# Patient Record
Sex: Female | Born: 1955 | Race: White | Hispanic: No | Smoking: Never smoker
Health system: Southern US, Community
[De-identification: ages and names within clinical notes are randomized; demographics above are authoritative.]

## PROBLEM LIST (undated history)

## (undated) DIAGNOSIS — G629 Polyneuropathy, unspecified: Secondary | ICD-10-CM

## (undated) DIAGNOSIS — H269 Unspecified cataract: Secondary | ICD-10-CM

## (undated) DIAGNOSIS — E119 Type 2 diabetes mellitus without complications: Secondary | ICD-10-CM

## (undated) DIAGNOSIS — F32A Depression, unspecified: Secondary | ICD-10-CM

## (undated) DIAGNOSIS — F329 Major depressive disorder, single episode, unspecified: Secondary | ICD-10-CM

## (undated) DIAGNOSIS — J40 Bronchitis, not specified as acute or chronic: Secondary | ICD-10-CM

## (undated) HISTORY — DX: Unspecified cataract: H26.9

## (undated) HISTORY — PX: ABDOMINAL HYSTERECTOMY: SHX81

## (undated) HISTORY — PX: CARPAL TUNNEL RELEASE: SHX101

## (undated) HISTORY — DX: Polyneuropathy, unspecified: G62.9

## (undated) HISTORY — PX: KNEE SURGERY: SHX244

---

## 2007-06-20 ENCOUNTER — Emergency Department (HOSPITAL_COMMUNITY): Admission: EM | Admit: 2007-06-20 | Discharge: 2007-06-21 | Payer: Self-pay | Admitting: Emergency Medicine

## 2010-12-17 LAB — COMPREHENSIVE METABOLIC PANEL
ALT: 26
Albumin: 3.9
Alkaline Phosphatase: 57
BUN: 21
Chloride: 105
Glucose, Bld: 144 — ABNORMAL HIGH
Potassium: 3.6
Sodium: 140
Total Bilirubin: 0.4
Total Protein: 7

## 2010-12-17 LAB — DIFFERENTIAL
Basophils Absolute: 0.2 — ABNORMAL HIGH
Basophils Relative: 2 — ABNORMAL HIGH
Eosinophils Relative: 2
Monocytes Absolute: 0.4
Monocytes Relative: 5

## 2010-12-17 LAB — POCT CARDIAC MARKERS: CKMB, poc: <1 — ABNORMAL LOW

## 2010-12-17 LAB — POCT I-STAT, CHEM 8
BUN: 26 — ABNORMAL HIGH
Calcium, Ion: 1.26
Chloride: 106
Glucose, Bld: 137 — ABNORMAL HIGH
HCT: 36
Potassium: 3.9

## 2010-12-17 LAB — D-DIMER, QUANTITATIVE: D-Dimer, Quant: 1.07 — ABNORMAL HIGH

## 2010-12-17 LAB — CBC
HCT: 34.8 — ABNORMAL LOW
Hemoglobin: 11.8 — ABNORMAL LOW
MCHC: 33.8
MCV: 87.9
RBC: 3.96
RDW: 14.4

## 2013-04-07 ENCOUNTER — Telehealth: Payer: Self-pay | Admitting: Cardiology

## 2013-04-07 NOTE — Telephone Encounter (Signed)
New message     Dr Jens Somrenshaw completed a FMLA form but there is one piece of missing information

## 2013-04-07 NOTE — Telephone Encounter (Signed)
Left message for Ann Pace, gave him the number to [employer] if he is missing paperwork. Left our contact number if he needs to talk with me.

## 2013-04-12 NOTE — Telephone Encounter (Signed)
Spoke with Ann Pace, questions regarding FMLA paperwork answered.

## 2013-12-25 ENCOUNTER — Encounter (HOSPITAL_BASED_OUTPATIENT_CLINIC_OR_DEPARTMENT_OTHER): Payer: Self-pay | Admitting: Emergency Medicine

## 2013-12-25 ENCOUNTER — Emergency Department (HOSPITAL_BASED_OUTPATIENT_CLINIC_OR_DEPARTMENT_OTHER)
Admission: EM | Admit: 2013-12-25 | Discharge: 2013-12-26 | Disposition: A | Discharge status: Home / Self Care | Payer: BC Managed Care – PPO | Attending: Emergency Medicine | Admitting: Emergency Medicine

## 2013-12-25 ENCOUNTER — Emergency Department (HOSPITAL_BASED_OUTPATIENT_CLINIC_OR_DEPARTMENT_OTHER): Payer: BC Managed Care – PPO

## 2013-12-25 DIAGNOSIS — Z794 Long term (current) use of insulin: Secondary | ICD-10-CM | POA: Diagnosis not present

## 2013-12-25 DIAGNOSIS — Z880 Allergy status to penicillin: Secondary | ICD-10-CM | POA: Diagnosis not present

## 2013-12-25 DIAGNOSIS — E119 Type 2 diabetes mellitus without complications: Secondary | ICD-10-CM | POA: Insufficient documentation

## 2013-12-25 DIAGNOSIS — R1033 Periumbilical pain: Secondary | ICD-10-CM | POA: Diagnosis not present

## 2013-12-25 DIAGNOSIS — R197 Diarrhea, unspecified: Secondary | ICD-10-CM | POA: Insufficient documentation

## 2013-12-25 DIAGNOSIS — R000 Tachycardia, unspecified: Secondary | ICD-10-CM | POA: Diagnosis not present

## 2013-12-25 DIAGNOSIS — Z79899 Other long term (current) drug therapy: Secondary | ICD-10-CM | POA: Insufficient documentation

## 2013-12-25 DIAGNOSIS — Z90710 Acquired absence of both cervix and uterus: Secondary | ICD-10-CM | POA: Diagnosis not present

## 2013-12-25 DIAGNOSIS — R112 Nausea with vomiting, unspecified: Secondary | ICD-10-CM | POA: Diagnosis present

## 2013-12-25 HISTORY — DX: Type 2 diabetes mellitus without complications: E11.9

## 2013-12-25 LAB — COMPREHENSIVE METABOLIC PANEL
ALK PHOS: 98 U/L (ref 39–117)
ALT: 28 U/L (ref 0–35)
AST: 14 U/L (ref 0–37)
Albumin: 4.5 g/dL (ref 3.5–5.2)
Anion gap: 23 — ABNORMAL HIGH (ref 5–15)
BILIRUBIN TOTAL: 0.3 mg/dL (ref 0.3–1.2)
BUN: 37 mg/dL — ABNORMAL HIGH (ref 6–23)
CHLORIDE: 96 meq/L (ref 96–112)
CO2: 20 meq/L (ref 19–32)
Calcium: 11.6 mg/dL — ABNORMAL HIGH (ref 8.4–10.5)
Creatinine, Ser: 1.1 mg/dL (ref 0.50–1.10)
GFR calc Af Amer: 64 mL/min — ABNORMAL LOW (ref >90)
GFR, EST NON AFRICAN AMERICAN: 55 mL/min — AB (ref >90)
Glucose, Bld: 398 mg/dL — ABNORMAL HIGH (ref 70–99)
POTASSIUM: 5 meq/L (ref 3.7–5.3)
SODIUM: 139 meq/L (ref 137–147)
Total Protein: 8.7 g/dL — ABNORMAL HIGH (ref 6.0–8.3)

## 2013-12-25 LAB — CBC
HCT: 47.2 % — ABNORMAL HIGH (ref 36.0–46.0)
Hemoglobin: 15.8 g/dL — ABNORMAL HIGH (ref 12.0–15.0)
MCH: 28.5 pg (ref 26.0–34.0)
MCHC: 33.5 g/dL (ref 30.0–36.0)
MCV: 85.2 fL (ref 78.0–100.0)
PLATELETS: 488 10*3/uL — AB (ref 150–400)
RBC: 5.54 MIL/uL — AB (ref 3.87–5.11)
RDW: 14.2 % (ref 11.5–15.5)
WBC: 15.5 10*3/uL — AB (ref 4.0–10.5)

## 2013-12-25 LAB — I-STAT VENOUS BLOOD GAS, ED
ACID-BASE DEFICIT: 4 mmol/L — AB (ref 0.0–2.0)
BICARBONATE: 21 meq/L (ref 20.0–24.0)
O2 Saturation: 55 %
TCO2: 22 mmol/L (ref 0–100)
pCO2, Ven: 37.9 mmHg — ABNORMAL LOW (ref 45.0–50.0)
pH, Ven: 7.352 — ABNORMAL HIGH (ref 7.250–7.300)
pO2, Ven: 30 mmHg (ref 30.0–45.0)

## 2013-12-25 LAB — CBG MONITORING, ED: GLUCOSE-CAPILLARY: 382 mg/dL — AB (ref 70–99)

## 2013-12-25 LAB — I-STAT CG4 LACTIC ACID, ED: Lactic Acid, Venous: 1.49 mmol/L (ref 0.5–2.2)

## 2013-12-25 MED ORDER — SODIUM CHLORIDE 0.9 % IV BOLUS (SEPSIS)
1000.0000 mL | Freq: Once | INTRAVENOUS | Status: AC
  Administered 2013-12-25: 1000 mL via INTRAVENOUS

## 2013-12-25 MED ORDER — IOHEXOL 300 MG/ML  SOLN: 100.0000 mL | Freq: Once | INTRAMUSCULAR | Status: AC | PRN

## 2013-12-25 MED ORDER — IOHEXOL 300 MG/ML  SOLN
50.0000 mL | Freq: Once | INTRAMUSCULAR | Status: AC | PRN
  Administered 2013-12-25: 50 mL via ORAL

## 2013-12-25 MED ORDER — ONDANSETRON HCL 4 MG/2ML IJ SOLN
4.0000 mg | Freq: Once | INTRAMUSCULAR | Status: AC
  Administered 2013-12-25: 4 mg via INTRAVENOUS
  Filled 2013-12-25: qty 2

## 2013-12-25 MED ORDER — MORPHINE SULFATE 4 MG/ML IJ SOLN
4.0000 mg | Freq: Once | INTRAMUSCULAR | Status: AC
  Administered 2013-12-25: 4 mg via INTRAVENOUS
  Filled 2013-12-25: qty 1

## 2013-12-25 MED ORDER — ONDANSETRON 4 MG PO TBDP
4.0000 mg | ORAL_TABLET | Freq: Once | ORAL | Status: AC
  Administered 2013-12-25: 4 mg via ORAL
  Filled 2013-12-25: qty 1

## 2013-12-25 NOTE — ED Notes (Addendum)
Pt reports N/V/D since yesterday with chills, sweats. CBG 382 in triage.

## 2013-12-25 NOTE — ED Provider Notes (Signed)
CSN: [id number]     Arrival date & time 12/25/13  1940 History   Chief Complaint  Patient presents with  . Emesis   Patient is a 58 y.o. female presenting with vomiting. The history is provided by the patient. No language interpreter was used.  Emesis Severity:  Moderate Duration:  1 day Timing:  Constant Quality:  Stomach contents Progression:  Worsening Chronicity:  New Relieved by:  Nothing Worsened by:  Nothing tried Ineffective treatments:  None tried Associated symptoms: abdominal pain and diarrhea   Associated symptoms: no chills, no cough, no fever, no sore throat and no URI   Risk factors: diabetes    HPI Comments: Ann Pace is a 58 y.o. female who presents to the Emergency Department complaining of N/V/D since last night  Past Medical History  Diagnosis Date  . Diabetes mellitus without complication    Past Surgical History  Procedure Laterality Date  . Abdominal hysterectomy     History reviewed. No pertinent family history. History  Substance Use Topics  . Smoking status: Never Smoker   . Smokeless tobacco: Not on file  . Alcohol Use: No   OB History   Grav Para Term Preterm Abortions TAB SAB Ect Mult Living                 Review of Systems  Constitutional: Negative for chills.  HENT: Negative for sore throat.   Respiratory: Negative for cough and shortness of breath.   Gastrointestinal: Positive for vomiting, abdominal pain and diarrhea.  All other systems reviewed and are negative.   Allergies  Penicillins  Home Medications   Prior to Admission medications   Medication Sig Start Date End Date Taking? Authorizing Provider  DULoxetine (CYMBALTA) 60 MG capsule Take 60 mg by mouth daily.   Yes Historical Provider, MD  insulin glargine (LANTUS) 100 UNIT/ML injection Inject 37 Units into the skin 2 (two) times daily.   Yes Historical Provider, MD  insulin lispro (HUMALOG) 100 UNIT/ML injection Inject 16 Units into the skin 3 (three) times daily  with meals.   Yes Historical Provider, MD  lisinopril (PRINIVIL,ZESTRIL) 10 MG tablet Take 10 mg by mouth daily.   Yes Historical Provider, MD  METFORMIN HCL PO Take by mouth 2 (two) times daily.   Yes Historical Provider, MD  Pioglitazone HCl (ACTOS PO) Take by mouth 2 (two) times daily.   Yes Historical Provider, MD   BP 135/99  Pulse 130  Temp(Src) 98 F (36.7 C) (Oral)  Resp 21  Ht 5\' 4"  (1.626 m)  Wt 230 lb (104.327 kg)  BMI 39.46 kg/m2  SpO2 95% Physical Exam  Nursing note and vitals reviewed. Constitutional: She is oriented to person, place, and time. She appears well-developed and well-nourished. No distress.  HENT:  Head: Normocephalic and atraumatic.  Mouth/Throat: Oropharynx is clear and moist.  Eyes: EOM are normal. Pupils are equal, round, and reactive to light.  Neck: Normal range of motion. Neck supple.  Cardiovascular: Regular rhythm.  Tachycardia present.  Exam reveals no friction rub.   No murmur heard. Pulmonary/Chest: Effort normal and breath sounds normal. No respiratory distress. She has no wheezes. She has no rales.  Abdominal: Soft. She exhibits no distension. There is tenderness in the periumbilical area. There is no rebound.  Musculoskeletal: Normal range of motion. She exhibits no edema.  Neurological: She is alert and oriented to person, place, and time.  Skin: No rash noted. She is not diaphoretic.    ED Course  Procedures (including critical care time) DIAGNOSTIC STUDIES: Oxygen Saturation is 95% on RA, normal by my interpretation.    COORDINATION OF CARE: 8:54 PM- Pt advised of plan for treatment and pt agrees.  Labs Review Labs Reviewed  CBG MONITORING, ED - Abnormal; Notable for the following:    Glucose-Capillary 382 (*)    All other components within normal limits  CBC  COMPREHENSIVE METABOLIC PANEL  URINALYSIS, ROUTINE W REFLEX MICROSCOPIC  BLOOD GAS, VENOUS  I-STAT CG4 LACTIC ACID, ED    Imaging Review Ct Abdomen Pelvis W  Contrast  12/25/2013   CLINICAL DATA:  Nausea, vomiting, and diarrhea since yesterday. Chills and sweats.  EXAM: CT ABDOMEN AND PELVIS WITH CONTRAST  TECHNIQUE: Multidetector CT imaging of the abdomen and pelvis was performed using the standard protocol following bolus administration of intravenous contrast.  CONTRAST:  50mL OMNIPAQUE IOHEXOL 300 MG/ML  SOLN  COMPARISON:  None.  FINDINGS: Linear opacities in the lung bases likely representing atelectasis.  Mild diffuse fatty infiltration of the liver. The gallbladder, pancreas, spleen, adrenal glands, abdominal aorta, inferior vena cava, and retroperitoneal lymph nodes are unremarkable. Small accessory spleen. Focal areas of scarring in both kidneys, greater on the left. Small cyst on the right kidney. No hydronephrosis or solid renal mass identified. Stomach, small bowel, and colon are not abnormally distended. No free air or free fluid in the abdomen. Abdominal wall musculature appears intact.  Pelvis: Bladder wall is not thickened. Uterus appears to be surgically absent. No pelvic mass or lymphadenopathy. Appendix is not identified. No inflammatory changes in the pelvis. No free or loculated pelvic fluid collections. No destructive bone lesions.  IMPRESSION: Diffuse fatty infiltration of the liver. Probable atelectasis in the lung bases. No acute process demonstrated otherwise in the abdomen or pelvis.   Electronically Signed   By: Burman NievesWilliam  Stevens M.D.   On: 12/25/2013 23:51     EKG Interpretation None      MDM   Final diagnoses:  Nausea vomiting and diarrhea    58 year old female with history of diabetes presents with nausea, vomiting, diarrhea. Began last night. No fevers, no dysuria. Patient has periumbilical pain on exam. No guarding or rebound. Missed her insulin this morning. Here she is hyperglycemic in the 380s. We'll check labs, give fluids. We'll plan for CT of her abdomen. CT negative. Labs ok. HR improving, feeling better and resting  comfortably on re-exam. Tolerating PO. Stable for discharge. I have reviewed all labs and imaging and considered them in my medical decision making.   Elwin MochaBlair Sueko Dimichele, MD 12/26/13 [phone]

## 2013-12-26 LAB — CBG MONITORING, ED: Glucose-Capillary: 284 mg/dL — ABNORMAL HIGH (ref 70–99)

## 2013-12-26 MED ORDER — ONDANSETRON 4 MG PO TBDP: ORAL_TABLET | ORAL | Status: DC

## 2013-12-26 NOTE — Discharge Instructions (Signed)

## 2013-12-26 NOTE — ED Notes (Signed)
I took CBG and got result of 284 mg. / dcltr.

## 2015-12-12 DIAGNOSIS — E785 Hyperlipidemia, unspecified: Secondary | ICD-10-CM

## 2015-12-12 DIAGNOSIS — I10 Essential (primary) hypertension: Secondary | ICD-10-CM | POA: Insufficient documentation

## 2015-12-12 DIAGNOSIS — E1169 Type 2 diabetes mellitus with other specified complication: Secondary | ICD-10-CM | POA: Insufficient documentation

## 2017-04-26 ENCOUNTER — Emergency Department (HOSPITAL_COMMUNITY): Payer: Self-pay

## 2017-04-26 ENCOUNTER — Emergency Department (HOSPITAL_COMMUNITY)
Admission: EM | Admit: 2017-04-26 | Discharge: 2017-04-26 | Disposition: A | Discharge status: Home / Self Care | Payer: Self-pay | Attending: Emergency Medicine | Admitting: Emergency Medicine

## 2017-04-26 ENCOUNTER — Encounter (HOSPITAL_COMMUNITY): Payer: Self-pay | Admitting: *Deleted

## 2017-04-26 DIAGNOSIS — E119 Type 2 diabetes mellitus without complications: Secondary | ICD-10-CM | POA: Insufficient documentation

## 2017-04-26 DIAGNOSIS — M25512 Pain in left shoulder: Secondary | ICD-10-CM | POA: Insufficient documentation

## 2017-04-26 DIAGNOSIS — Z794 Long term (current) use of insulin: Secondary | ICD-10-CM | POA: Insufficient documentation

## 2017-04-26 DIAGNOSIS — Z79899 Other long term (current) drug therapy: Secondary | ICD-10-CM | POA: Insufficient documentation

## 2017-04-26 MED ORDER — ACETAMINOPHEN 500 MG PO TABS
1000.0000 mg | ORAL_TABLET | Freq: Once | ORAL | Status: AC
  Administered 2017-04-26: 1000 mg via ORAL
  Filled 2017-04-26: qty 2

## 2017-04-26 NOTE — ED Provider Notes (Signed)
[facility] EMERGENCY DEPARTMENT Provider Note   CSN: [id number] Arrival date & time: 04/26/17  1752     History   Chief Complaint Chief Complaint  Patient presents with  . Shoulder Pain    HPI Ann Pace is a 62 y.o. female with history of diabetes and peripheral neuropathy presents today with chief complaint acute onset, progressively worsening left shoulder pain for 3 days.  She states that pain is constant, aching and occasionally sharp.  Pain radiates down her entire upper extremity and she will also have circumferential numbness intermittently.  She denies weakness, fevers, chills, neck pain, chest pain, or shortness of breath.  No prior history of DVT or PE.  She has tried Neurontin which she takes for her neuropathy in her lower extremities without significant relief of her symptoms and she has also applied a cream to her shoulder without significant relief.  Pain worsens with range of motion and palpation.  She notes that she has been under a lot of stress.  Recently her husband passed away and she also lost her job and today she had to take her father to the hospital for evaluation as well.  She states that she thinks her heart rate has been fast as a result.  The history is provided by the patient.    Past Medical History:  Diagnosis Date  . Diabetes mellitus without complication (HCC)     There are no active problems to display for this patient.   Past Surgical History:  Procedure Laterality Date  . ABDOMINAL HYSTERECTOMY      OB History    No data available       Home Medications    Prior to Admission medications   Medication Sig Start Date End Date Taking? Authorizing Provider  DULoxetine (CYMBALTA) 60 MG capsule Take 60 mg by mouth daily.    [provider]  insulin glargine (LANTUS) 100 UNIT/ML injection Inject 37 Units into the skin 2 (two) times daily.    [provider]  insulin lispro (HUMALOG) 100 UNIT/ML  injection Inject 16 Units into the skin 3 (three) times daily with meals.    [provider]  lisinopril (PRINIVIL,ZESTRIL) 10 MG tablet Take 10 mg by mouth daily.    [provider]  METFORMIN HCL PO Take by mouth 2 (two) times daily.    [provider]  ondansetron (ZOFRAN ODT) 4 MG disintegrating tablet 4mg  ODT q4 hours prn nausea/vomit 12/26/13   Elwin Mocha, MD  Pioglitazone HCl (ACTOS PO) Take by mouth 2 (two) times daily.    [provider]    Family History No family history on file.  Social History Social History   Tobacco Use  . Smoking status: Never Smoker  . Smokeless tobacco: Never Used  Substance Use Topics  . Alcohol use: No  . Drug use: No     Allergies   Penicillins   Review of Systems Review of Systems  Constitutional: Negative for chills and fever.  Respiratory: Negative for cough and shortness of breath.   Cardiovascular: Negative for chest pain.  Gastrointestinal: Negative for abdominal pain, nausea and vomiting.  Musculoskeletal: Positive for arthralgias (Left shoulder). Negative for back pain and neck pain.  Neurological: Positive for numbness. Negative for syncope and weakness.     Physical Exam Updated Vital Signs BP (!) 147/77 (BP Location: Right Arm)   Pulse 99   Temp 98.6 F (37 C) (Oral)   Resp 15   Ht 5'  4.5" (1.638 m)   Wt 77.1 kg (170 lb)   SpO2 93%   BMI 28.73 kg/m    Physical Exam  Constitutional: She is oriented to person, place, and time. She appears well-developed and well-nourished. No distress.  HENT:  Head: Normocephalic and atraumatic.  Eyes: Conjunctivae are normal. Right eye exhibits no discharge. Left eye exhibits no discharge.  Neck: Normal range of motion. Neck supple. No JVD present. No tracheal deviation present.  No midline spine TTP, no paraspinal muscle tenderness, no deformity, crepitus, or step-off noted   Cardiovascular: Normal rate, regular rhythm and intact distal  pulses.  2+ DP/PT pulses bilaterally  Pulmonary/Chest: Effort normal and breath sounds normal. No stridor. No respiratory distress. She has no wheezes. She has no rales. She exhibits no tenderness.  Abdominal: She exhibits no distension.  Musculoskeletal: She exhibits tenderness. She exhibits no edema.  Decreased passive range of motion with abduction, abduction, and upward range of motion of the left shoulder secondary to pain.  Normal passive range of motion with no restriction.  She has point tenderness overlying the left acromioclavicular joint with no erythema, swelling, deformity, or crepitus.  5/5 strength of BUE major muscle groups.  Positive empty can sign and positive Hawkins and Neer's impingement test on the left.  Positive arc of pain.  Neurological: She is alert and oriented to person, place, and time. No cranial nerve deficit or sensory deficit. She exhibits normal muscle tone.  Fluent speech, no facial droop, sensation intact to soft touch of bilateral upper extremities good grip strength bilaterally  Skin: Skin is warm and dry. No erythema.  Psychiatric: She has a normal mood and affect. Her behavior is normal.  Nursing note and vitals reviewed.    ED Treatments / Results  Labs (all labs ordered are listed, but only abnormal results are displayed) Labs Reviewed - No data to display  EKG  EKG Interpretation  Date/Time:  Saturday April 26 2017 18:16:10 EST Ventricular Rate:  100 PR Interval:  138 QRS Duration: 90 QT Interval:  350 QTC Calculation: 451 R Axis:   -60 Text Interpretation:  Normal sinus rhythm Left axis deviation Cannot rule out Anterior infarct , age undetermined Abnormal ECG Confirmed by Kristine RoyalMessick, Peter [phone]([id number]) on 04/26/2017 7:41:30 PM       Radiology Dg Shoulder Left  Result Date: 04/26/2017 CLINICAL DATA:  Acute left shoulder pain without known injury. EXAM: LEFT SHOULDER - 2+ VIEW COMPARISON:  None. FINDINGS: There is no evidence of fracture or  dislocation. Mild degenerative changes seen involving the left acromioclavicular joint. Soft tissues are unremarkable. 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 No acute abnormality seen in the left shoulder. Electronically Signed   By: Lupita RaiderJames  Green Jr, M.D.   On: 04/26/2017 19:15    Procedures Procedures (including critical care time)  Medications Ordered in ED Medications  acetaminophen (TYLENOL) tablet 1,000 mg (not administered)     Initial Impression / Assessment and Plan / ED Course  I have reviewed the triage vital signs and the nursing notes.  Pertinent labs & imaging results that were available during my care of the patient were reviewed by me and considered in my medical decision making (see chart for details).     Patient with left shoulder pain reproducible on palpation and with range of motion.  Afebrile, initially tachycardic with improvement while in the ED.  She states that she is under a lot of stress and thinks that her tachycardia is secondary  to this.  EKG shows no ST segment abnormality or arrhythmia.  I doubt ACS or MI in the absence of chest pain.  I doubt PE in the absence of shortness of breath or hypoxia.  Pain appears to be entirely musculoskeletal in nature she is focally tender overlying an area of arthritis seen on her radiographs.  Doubt septic joint or gout. RICE therapy indicated and discussed.  She will follow-up with [facility] for reevaluation of her symptoms and further recommendations.  Discussed indications for return to the ED. Pt verbalized understanding of and agreement with plan and is safe for discharge home at this time. No complaints prior to discharge.  Final Clinical Impressions(s) / ED Diagnoses   Final diagnoses:  Acute pain of left shoulder    ED Discharge Orders    None      Bennye Alm 04/26/17 1953  Wynetta Fines, MD 04/27/17 [phone]

## 2017-04-26 NOTE — ED Triage Notes (Signed)
Pt c/o L shoulder pain onset x 3 days, denies injury, pt reports numbness running down her arm, pt denies SOB, pt denies  N/v/d, A&O x4

## 2017-04-26 NOTE — Discharge Instructions (Signed)
1. Medications: Alternate 600 mg of ibuprofen and [phone] mg of Tylenol every 3 hours as needed for pain. Do not exceed 4000 mg of Tylenol daily, usual home medications 2. Treatment: rest, ice, elevate, drink plenty of fluids, gentle stretching 3. Follow Up: Please followup with orthopedics as directed or your PCP in 1 week if no improvement for discussion of your diagnoses and further evaluation after today's visit; if you do not have a primary care doctor use the resource guide provided to find one; Please return to the ER for worsening symptoms or other concerns such as fevers, weakness, chest pain, or shortness of breath

## 2017-05-15 ENCOUNTER — Encounter (INDEPENDENT_AMBULATORY_CARE_PROVIDER_SITE_OTHER): Payer: Self-pay | Admitting: Physician Assistant

## 2017-05-15 ENCOUNTER — Ambulatory Visit (INDEPENDENT_AMBULATORY_CARE_PROVIDER_SITE_OTHER): Payer: Self-pay | Admitting: Physician Assistant

## 2017-05-15 VITALS — BP 148/79 | HR 91 | Temp 98.1°F | Resp 18 | Ht 64.5 in | Wt 187.0 lb

## 2017-05-15 DIAGNOSIS — F418 Other specified anxiety disorders: Secondary | ICD-10-CM

## 2017-05-15 DIAGNOSIS — H269 Unspecified cataract: Secondary | ICD-10-CM

## 2017-05-15 DIAGNOSIS — Z760 Encounter for issue of repeat prescription: Secondary | ICD-10-CM

## 2017-05-15 DIAGNOSIS — E119 Type 2 diabetes mellitus without complications: Secondary | ICD-10-CM | POA: Insufficient documentation

## 2017-05-15 DIAGNOSIS — Z794 Long term (current) use of insulin: Secondary | ICD-10-CM

## 2017-05-15 DIAGNOSIS — E1142 Type 2 diabetes mellitus with diabetic polyneuropathy: Secondary | ICD-10-CM

## 2017-05-15 DIAGNOSIS — Z09 Encounter for follow-up examination after completed treatment for conditions other than malignant neoplasm: Secondary | ICD-10-CM

## 2017-05-15 LAB — GLUCOSE, POCT (MANUAL RESULT ENTRY): POC GLUCOSE: 184 mg/dL — AB (ref 70–99)

## 2017-05-15 LAB — POCT GLYCOSYLATED HEMOGLOBIN (HGB A1C): Hemoglobin A1C: 7.6

## 2017-05-15 MED ORDER — INSULIN GLARGINE 100 UNIT/ML SOLOSTAR PEN: 40.0000 [IU] | PEN_INJECTOR | Freq: Two times a day (BID) | SUBCUTANEOUS | 11 refills | Status: DC | PRN

## 2017-05-15 MED ORDER — DAPAGLIFLOZIN PROPANEDIOL 10 MG PO TABS: 10.0000 mg | ORAL_TABLET | Freq: Every day | ORAL | 11 refills | Status: DC

## 2017-05-15 MED ORDER — INSULIN LISPRO 100 UNIT/ML ~~LOC~~ SOLN: SUBCUTANEOUS | 11 refills | Status: DC

## 2017-05-15 MED ORDER — INSULIN GLARGINE 100 UNIT/ML ~~LOC~~ SOLN: 40.0000 [IU] | Freq: Two times a day (BID) | SUBCUTANEOUS | 5 refills | Status: DC

## 2017-05-15 MED ORDER — LISINOPRIL 10 MG PO TABS: 10.0000 mg | ORAL_TABLET | Freq: Every day | ORAL | 3 refills | Status: DC

## 2017-05-15 MED ORDER — INSULIN ASPART 100 UNIT/ML ~~LOC~~ SOLN: SUBCUTANEOUS | 11 refills | Status: DC

## 2017-05-15 MED ORDER — BUPROPION HCL ER (XL) 300 MG PO TB24: 300.0000 mg | ORAL_TABLET | Freq: Every day | ORAL | 5 refills | Status: DC

## 2017-05-15 MED ORDER — PEN NEEDLES 30G X 8 MM MISC: 1.0000 "application " | Freq: Two times a day (BID) | 0 refills | Status: DC

## 2017-05-15 MED ORDER — DULOXETINE HCL 60 MG PO CPEP: 60.0000 mg | ORAL_CAPSULE | Freq: Every day | ORAL | 3 refills | Status: DC

## 2017-05-15 MED ORDER — ALLOPURINOL 300 MG PO TABS: 300.0000 mg | ORAL_TABLET | Freq: Every day | ORAL | 11 refills | Status: DC

## 2017-05-15 MED FILL — FARXIGA 10 MG TABLET: 10 | 30 days supply | Qty: 30 | Fill #0

## 2017-05-15 MED FILL — ?ALLOPURINOL 300 MG TABL: 300 | 30 days supply | Qty: 30 | Fill #0

## 2017-05-15 MED FILL — TRUEPLUS PEN NDL 31GX5/16: 31G X 8 MM | 30 days supply | Qty: 100 | Fill #0

## 2017-05-15 MED FILL — BUPROPION HCL XL 300 MG TAB: 300 | 30 days supply | Qty: 30 | Fill #0

## 2017-05-15 MED FILL — ?DULoxetine HCL 60MG CPEP: 60 | 30 days supply | Qty: 30 | Fill #0

## 2017-05-15 MED FILL — LISINOPRIL 10 MG TABS: 10 | 30 days supply | Qty: 30 | Fill #0

## 2017-05-15 MED FILL — TRUEPLUS PEN NDL 31GX5/16": 31G X 8 MM | 30 days supply | Qty: 100 | Fill #0

## 2017-05-15 MED FILL — !LANTUS SOLOSTAR 100UNITS/M: 100 | 18 days supply | Qty: 15 | Fill #0

## 2017-05-15 NOTE — Patient Instructions (Signed)

## 2017-05-15 NOTE — Progress Notes (Signed)
Subjective:  Patient ID: Ann Pace, female    DOB: 14-Nov-1955  Age: 62 y.o. MRN: [id number]  CC: DM   HPI Ann Pace is a 62 y.o. female with a medical history of DM2, peripheral neuropathy, and gout presents for management of her DM2. She recently went to the ED for left sided chest pain that radiated to her LUE. EKG was unremarkable. No labs drawn. XR of left shoulder revealed mild degenerative joint disease of the left AC joint. Patient attributes chest and shoulder pain to being under much stress. Stressors include the passing of her husband 16 months ago, the recent injury of her father, and the loss of her job at [employer] due to partial visual loss 2/2 cataracts. She is financial unprepared to have cataract surgery and does not know how she will get back to work. CBG 184 and A1c 7.6% in clinic today. No current chest pain, palpitations, SOB, HA, abdominal pain, f/c/n/v, rash, or GI/GU sxs. Pt states she has run out of her medications yesterday and needs a refill.     Outpatient Medications Prior to Visit  Medication Sig Dispense Refill  . DULoxetine (CYMBALTA) 60 MG capsule Take 60 mg by mouth daily.    . insulin glargine (LANTUS) 100 UNIT/ML injection Inject 37 Units into the skin 2 (two) times daily.    . insulin lispro (HUMALOG) 100 UNIT/ML injection Inject 16 Units into the skin 3 (three) times daily with meals.    Marland Kitchen lisinopril (PRINIVIL,ZESTRIL) 10 MG tablet Take 10 mg by mouth daily.    Marland Kitchen METFORMIN HCL PO Take by mouth 2 (two) times daily.    . ondansetron (ZOFRAN ODT) 4 MG disintegrating tablet 4mg  ODT q4 hours prn nausea/vomit 12 tablet 0  . Pioglitazone HCl (ACTOS PO) Take by mouth 2 (two) times daily.     No facility-administered medications prior to visit.      ROS Review of Systems  Constitutional: Negative for chills, fever and malaise/fatigue.  Eyes: Positive for blurred vision. Negative for pain.  Respiratory: Negative for shortness of breath.   Cardiovascular:  Negative for chest pain and palpitations.  Gastrointestinal: Negative for abdominal pain and nausea.  Genitourinary: Negative for dysuria and hematuria.  Musculoskeletal: Negative for joint pain and myalgias.  Skin: Negative for rash.  Neurological: Negative for tingling and headaches.  Psychiatric/Behavioral: Positive for depression. The patient is nervous/anxious.     Objective:   Vitals:   05/15/17 1433  BP: (!) 148/79  Pulse: 91  Resp: 18  Temp: 98.1 F (36.7 C)  SpO2: 97%    Physical Exam  Constitutional: She is oriented to person, place, and time.  Well developed, overweight, NAD, polite  HENT:  Head: Normocephalic and atraumatic.  Eyes: Conjunctivae and EOM are normal. Pupils are equal, round, and reactive to light. No scleral icterus.  Neck: Normal range of motion. Neck supple. No thyromegaly present.  Cardiovascular: Normal rate, regular rhythm and normal heart sounds.  Pulmonary/Chest: Effort normal and breath sounds normal.  Musculoskeletal: She exhibits no edema.  Neurological: She is alert and oriented to person, place, and time.  Skin: Skin is warm and dry. No rash noted. No erythema. No pallor.  Psychiatric: Her behavior is normal. Thought content normal.  Tearful for most of interview.   Vitals reviewed.    Assessment & Plan:   1. Type 2 diabetes mellitus with diabetic polyneuropathy, with long-term current use of insulin (HCC) - HgB A1c - Glucose (CBG) - lisinopril (PRINIVIL,ZESTRIL) 10 MG  tablet; Take 1 tablet (10 mg total) by mouth daily.  Dispense: 90 tablet; Refill: 3 - DULoxetine (CYMBALTA) 60 MG capsule; Take 1 capsule (60 mg total) by mouth daily.  Dispense: 90 capsule; Refill: 3 - dapagliflozin propanediol (FARXIGA) 10 MG TABS tablet; Take 10 mg by mouth daily.  Dispense: 30 tablet; Refill: 11 - insulin glargine (LANTUS) 100 UNIT/ML injection; Inject 0.4 mLs (40 Units total) into the skin 2 (two) times daily.  Dispense: 10 mL; Refill: 5 - CBC  with Differential - Comprehensive metabolic panel - insulin lispro (HUMALOG) 100 UNIT/ML injection; If sugar 150-200 take 2 units If sugar 201-251 take 4 units If sugar 251-300 take 6 units If sugar 301-350 take 8 units If sugar 351-400 take 10 units  Dispense: 10 mL; Refill: 11 - TSH  2. Depression with anxiety - Refill DULoxetine (CYMBALTA) 60 MG capsule; Take 1 capsule (60 mg total) by mouth daily.  Dispense: 90 capsule; Refill: 3 - Refill Wellbutrin 300 ER, one tablet qday.  - TSH  3. Hospital discharge follow-up - Notes from ED reviewed  4. Medication refill - allopurinol (ZYLOPRIM) 300 MG tablet; Take 1 tablet (300 mg total) by mouth daily.  Dispense: 30 tablet; Refill: 11  5. Cataract of both eyes, unspecified cataract type - I have advised patient that her best option to to [facility]. She would still have to pay $165 ophthalmology consult fee but, if accepted for financial assistance, she may be able to have cataracts removed for free or for a reasonable price. [facility]  Meds ordered this encounter  Medications  . lisinopril (PRINIVIL,ZESTRIL) 10 MG tablet    Sig: Take 1 tablet (10 mg total) by mouth daily.    Dispense:  90 tablet    Refill:  3    Order Specific Question:   Supervising Provider    Answer:   Quentin AngstJEGEDE, OLUGBEMIGA E [id number]]  . DULoxetine (CYMBALTA) 60 MG capsule    Sig: Take 1 capsule (60 mg total) by mouth daily.    Dispense:  90 capsule    Refill:  3    Order Specific Question:   Supervising Provider    Answer:   Quentin AngstJEGEDE, OLUGBEMIGA E [id number]]  . allopurinol (ZYLOPRIM) 300 MG tablet    Sig: Take 1 tablet (300 mg total) by mouth daily.    Dispense:  30 tablet    Refill:  11    Order Specific Question:   Supervising Provider    Answer:   Quentin AngstJEGEDE, OLUGBEMIGA E [id number]  . dapagliflozin propanediol (FARXIGA) 10 MG TABS tablet    Sig: Take 10 mg by mouth daily.     Dispense:  30 tablet    Refill:  11    Order Specific Question:   Supervising Provider    Answer:   Quentin AngstJEGEDE, OLUGBEMIGA E [id number]]  . insulin glargine (LANTUS) 100 UNIT/ML injection    Sig: Inject 0.4 mLs (40 Units total) into the skin 2 (two) times daily.    Dispense:  10 mL    Refill:  5    Order Specific Question:   Supervising Provider    Answer:   Quentin AngstJEGEDE, OLUGBEMIGA E [id number]]  . DISCONTD: insulin aspart (NOVOLOG) 100 UNIT/ML injection    Sig: If sugar 150-200 take 2 units If sugar 201-251 take 4 units If sugar 251-300 take 6 units If sugar 301-350 take 8 units If sugar 351-400 take 10 units  Dispense:  10 mL    Refill:  11    Order Specific Question:   Supervising Provider    Answer:   Quentin Angst [id number]  . insulin lispro (HUMALOG) 100 UNIT/ML injection    Sig: If sugar 150-200 take 2 units If sugar 201-251 take 4 units If sugar 251-300 take 6 units If sugar 301-350 take 8 units If sugar 351-400 take 10 units    Dispense:  10 mL    Refill:  11    Order Specific Question:   Supervising Provider    Answer:   Quentin Angst [[id number]]    Follow-up: Return in about 4 weeks (around 06/12/2017) for depression and DM.   Loletta Specter PA

## 2017-05-16 ENCOUNTER — Telehealth: Payer: Self-pay | Admitting: Licensed Clinical Social Worker

## 2017-05-16 ENCOUNTER — Ambulatory Visit: Payer: Self-pay | Attending: Physician Assistant

## 2017-05-16 LAB — COMPREHENSIVE METABOLIC PANEL
ALT: 11 IU/L (ref 0–32)
AST: 9 IU/L (ref 0–40)
Albumin/Globulin Ratio: 1.6 (ref 1.2–2.2)
Albumin: 4.5 g/dL (ref 3.6–4.8)
Alkaline Phosphatase: 155 IU/L — ABNORMAL HIGH (ref 39–117)
BUN/Creatinine Ratio: 16 (ref 12–28)
BUN: 15 mg/dL (ref 8–27)
Bilirubin Total: 0.3 mg/dL (ref 0.0–1.2)
CALCIUM: 10.5 mg/dL — AB (ref 8.7–10.3)
CO2: 21 mmol/L (ref 20–29)
CREATININE: 0.96 mg/dL (ref 0.57–1.00)
Chloride: 107 mmol/L — ABNORMAL HIGH (ref 96–106)
GFR calc Af Amer: 74 mL/min/{1.73_m2} (ref >59)
GFR, EST NON AFRICAN AMERICAN: 64 mL/min/{1.73_m2} (ref >59)
GLUCOSE: 181 mg/dL — AB (ref 65–99)
Globulin, Total: 2.8 g/dL (ref 1.5–4.5)
Potassium: 4.4 mmol/L (ref 3.5–5.2)
Sodium: 144 mmol/L (ref 134–144)
Total Protein: 7.3 g/dL (ref 6.0–8.5)

## 2017-05-16 LAB — CBC WITH DIFFERENTIAL/PLATELET
BASOS ABS: 0 10*3/uL (ref 0.0–0.2)
BASOS: 0 %
EOS (ABSOLUTE): 0.1 10*3/uL (ref 0.0–0.4)
Eos: 1 %
Hematocrit: 43.5 % (ref 34.0–46.6)
Hemoglobin: 14.4 g/dL (ref 11.1–15.9)
IMMATURE GRANS (ABS): 0 10*3/uL (ref 0.0–0.1)
IMMATURE GRANULOCYTES: 0 %
Lymphocytes Absolute: 2.4 10*3/uL (ref 0.7–3.1)
Lymphs: 24 %
MCH: 28.9 pg (ref 26.6–33.0)
MCHC: 33.1 g/dL (ref 31.5–35.7)
MCV: 87 fL (ref 79–97)
Monocytes Absolute: 0.5 10*3/uL (ref 0.1–0.9)
Monocytes: 5 %
NEUTROS PCT: 70 %
Neutrophils Absolute: 6.9 10*3/uL (ref 1.4–7.0)
PLATELETS: 324 10*3/uL (ref 150–379)
RBC: 4.99 x10E6/uL (ref 3.77–5.28)
RDW: 14 % (ref 12.3–15.4)
WBC: 10.1 10*3/uL (ref 3.4–10.8)

## 2017-05-16 LAB — TSH: TSH: 1.83 u[IU]/mL (ref 0.450–4.500)

## 2017-05-16 NOTE — Telephone Encounter (Signed)
LCSWA attempted to contact pt to follow up on behavioral health screens from prior appointment. LCSWA left message for a return call.  

## 2017-05-19 ENCOUNTER — Telehealth: Payer: Self-pay | Admitting: Licensed Clinical Social Worker

## 2017-05-19 NOTE — Telephone Encounter (Signed)
LCSWA received an incoming call from pt. LCSWA introduced self and [facility]. LCSWA disclosed that she received a consult from Conley SimmondsPA-C Gomez to address depression and anxiety.   Pt agreed to schedule appointment for Wednesday, February 27, 19.

## 2017-05-20 ENCOUNTER — Telehealth (INDEPENDENT_AMBULATORY_CARE_PROVIDER_SITE_OTHER): Payer: Self-pay | Admitting: *Deleted

## 2017-05-20 NOTE — Telephone Encounter (Signed)
-----   Message from Loletta Specteroger David Gomez, PA-C sent at 05/19/2017 11:27 AM EST ----- Labs are essentially unremarkable except for elevated sugar. Control DM2.

## 2017-05-20 NOTE — Telephone Encounter (Signed)
Patient verified DOB Patient is aware of labs being normal except for DM. Patient advised to take medications as prescribed to obtain better controlled DM. No further questions.

## 2017-05-21 ENCOUNTER — Ambulatory Visit: Payer: Self-pay | Attending: Internal Medicine | Admitting: Licensed Clinical Social Worker

## 2017-05-21 DIAGNOSIS — F321 Major depressive disorder, single episode, moderate: Secondary | ICD-10-CM

## 2017-05-21 NOTE — BH Specialist Note (Signed)
Integrated Behavioral Health Initial Visit  MRN: [id number] Name: Ann Pace  Number of Integrated Behavioral Health Clinician visits:: 1/6 Session Start time: 2:00 PM  Session End time: 3:00 PM Total time: 1 hour  Type of Service: Integrated Behavioral Health- Individual/Family Interpretor:No. Interpretor Name and Language: N/A   Warm Hand Off Completed.       SUBJECTIVE: Ann Pace is a 62 y.o. female accompanied by self Patient was referred by Conley SimmondsPA-C Gomez for depression and anxiety. Patient reports the following symptoms/concerns: feelings of sadness and worry, feeling bad about self, difficulty sleeping, low energy, decreased concentration, and irritability Duration of problem: 1.5 years; Severity of problem: moderate  OBJECTIVE: Mood: Anxious and Pleasant and Affect: Appropriate Risk of harm to self or others: No plan to harm self or others  LIFE CONTEXT: Family and Social: Pt currently resides with her elderly parents. She receives emotional support from her adult children, grandchildren, and friend.  School/Work: Pt lost her job Dec 2018 due to vision loss. She applied for Medicaid last month and is awaiting decision. Next month, pt will receive spousal benefits ($1000 monthly) Self-Care: Pt enjoys coloring to cope with stressors. She participates in medication management through PCP Life Changes: Pt is grieving the loss of her spouse. She has ongoing medical conditions, recently loss her job, and is experiencing financial strain  GOALS ADDRESSED: Patient will: 1. Reduce symptoms of: anxiety, depression and stress 2. Increase knowledge and/or ability of: coping skills  3. Demonstrate ability to: Increase healthy adjustment to current life circumstances and Increase adequate support systems for patient/family  INTERVENTIONS: Interventions utilized: Mindfulness or Management consultantelaxation Training, Supportive Counseling, Psychoeducation and/or Health Education and Link to [employer]  Resources  Standardized Assessments completed: GAD-7 and PHQ 2&9  ASSESSMENT: Patient currently experiencing depression and anxiety triggered by ongoing medical conditions, financial strain, and the loss of spouse. She reports feelings of sadness and worry, feeling bad about self, difficulty sleeping, low energy, decreased concentration, and irritability. Pt receives financial and emotional support from family. Denied SI/HI/AVH.   Patient may benefit from psychoeducation and psychotherapy. LCSWA educated pt on correlation between one's physical and mental health, in addition, to how stress can negatively impact health. Therapeutic interventions were discussed to decrease symptoms. Pt successfully identified healthy coping skills and participates in medication management through PCP. LCSWA provided information on grief support services and pt agreed to complete [employer] application to assist with transportation.   PLAN: 1. Follow up with behavioral health clinician on : Pt was encouraged to contact LCSWA if symptoms worsen or fail to improve to [facility]. 2. Behavioral recommendations: LCSWA recommends that pt apply healthy coping skills discussed, comply with medication management, and utilize provided resources. Pt is encouraged to schedule follow up appointment with LCSWA 3. Referral(s): Integrated Art gallery managerBehavioral Health Services (In Clinic) and [employer] Resources:  Transportation 4. "From scale of 1-10, how likely are you to follow plan?": 10/10  Bridgett LarssonJasmine D Lewis, LCSW 05/22/17 9:26 PM

## 2017-06-05 ENCOUNTER — Telehealth: Payer: Self-pay | Admitting: Physician Assistant

## 2017-06-05 NOTE — Telephone Encounter (Signed)
Call placed to patient on both numbers regarding her SCAT application. Wanted to ask patient if rep from [employer] had reached out to her and set up an assessment appointment and if she had gotten approved. Left message with patient's daughter asking patient to return my call. And no answer or vm on [phone].

## 2017-06-05 NOTE — Telephone Encounter (Signed)
Patient said she had heard from [employer] they said she lived to far out

## 2017-06-06 MED FILL — !LANTUS SOLOSTAR 100UNITS/M: 100 | 30 days supply | Qty: 24 | Fill #1

## 2017-06-12 ENCOUNTER — Other Ambulatory Visit: Payer: Self-pay

## 2017-06-12 ENCOUNTER — Telehealth: Payer: Self-pay | Admitting: Physician Assistant

## 2017-06-12 ENCOUNTER — Ambulatory Visit (INDEPENDENT_AMBULATORY_CARE_PROVIDER_SITE_OTHER): Payer: Self-pay | Admitting: Physician Assistant

## 2017-06-12 ENCOUNTER — Encounter (INDEPENDENT_AMBULATORY_CARE_PROVIDER_SITE_OTHER): Payer: Self-pay | Admitting: Physician Assistant

## 2017-06-12 VITALS — BP 132/84 | HR 92 | Temp 98.0°F | Wt 187.8 lb

## 2017-06-12 DIAGNOSIS — Z794 Long term (current) use of insulin: Secondary | ICD-10-CM

## 2017-06-12 DIAGNOSIS — Z23 Encounter for immunization: Secondary | ICD-10-CM

## 2017-06-12 DIAGNOSIS — M25511 Pain in right shoulder: Secondary | ICD-10-CM

## 2017-06-12 DIAGNOSIS — E1142 Type 2 diabetes mellitus with diabetic polyneuropathy: Secondary | ICD-10-CM

## 2017-06-12 DIAGNOSIS — F418 Other specified anxiety disorders: Secondary | ICD-10-CM

## 2017-06-12 DIAGNOSIS — Z1159 Encounter for screening for other viral diseases: Secondary | ICD-10-CM

## 2017-06-12 DIAGNOSIS — Z114 Encounter for screening for human immunodeficiency virus [HIV]: Secondary | ICD-10-CM

## 2017-06-12 DIAGNOSIS — Z1231 Encounter for screening mammogram for malignant neoplasm of breast: Secondary | ICD-10-CM

## 2017-06-12 DIAGNOSIS — Z1239 Encounter for other screening for malignant neoplasm of breast: Secondary | ICD-10-CM

## 2017-06-12 DIAGNOSIS — E119 Type 2 diabetes mellitus without complications: Secondary | ICD-10-CM

## 2017-06-12 MED ORDER — CELECOXIB 200 MG PO CAPS: 200.0000 mg | ORAL_CAPSULE | Freq: Two times a day (BID) | ORAL | 2 refills | Status: DC

## 2017-06-12 MED ORDER — PEN NEEDLES 30G X 8 MM MISC: 1.0000 "application " | Freq: Every day | 11 refills | Status: DC

## 2017-06-12 MED ORDER — INSULIN GLARGINE 100 UNIT/ML SOLOSTAR PEN: PEN_INJECTOR | SUBCUTANEOUS | 11 refills | Status: DC

## 2017-06-12 MED ORDER — INSULIN LISPRO 100 UNIT/ML (KWIKPEN): PEN_INJECTOR | SUBCUTANEOUS | 11 refills | Status: DC

## 2017-06-12 MED ORDER — NAPROXEN 500 MG PO TABS: 500.0000 mg | ORAL_TABLET | Freq: Two times a day (BID) | ORAL | 0 refills | Status: DC

## 2017-06-12 MED FILL — CELECOXIB 200 MG CAPSULE: 200 | 30 days supply | Qty: 60 | Fill #0

## 2017-06-12 MED FILL — NAPROXEN 500 MG TABLET: 500 | 20 days supply | Qty: 40 | Fill #0

## 2017-06-12 MED FILL — TRUEPLUS PEN NDL 31GX5/16": 31G X 8 MM | 20 days supply | Qty: 100 | Fill #0

## 2017-06-12 MED FILL — !HUMALOG 100 UNITS/ML KWIKP: 100 | 30 days supply | Qty: 9 | Fill #0

## 2017-06-12 MED FILL — TRUEPLUS PEN NDL 31GX5/16: 31G X 8 MM | 20 days supply | Qty: 100 | Fill #0

## 2017-06-12 NOTE — Progress Notes (Signed)
Subjective:  Patient ID: Ann Pace, female    DOB: 01-15-1956  Age: 62 y.o. MRN: [id number]  CC: f/u depression and DM  HPI Ann Pace is a 62 y.o. female with a medical history of DM2, peripheral neuropathy, and gout presents for management of her DM2. Taking Farxiga, Lantus 40 units BID. Feels well. Does not endorse polydipsia, polyuria, polyphagia, tingling, numbness, or fatigue. Endorses visual blurring but this is attributed to her cataracts. She is contemplating on starting the disability process but has delayed somewhat because she feels that she is physically able to work. 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 The LCSW advised to possibly use the [facility] to help finance an ophthalmological consult and cataract removal surgery.      Outpatient Medications Prior to Visit  Medication Sig Dispense Refill  . allopurinol (ZYLOPRIM) 300 MG tablet Take 1 tablet (300 mg total) by mouth daily. 30 tablet 11  . buPROPion (WELLBUTRIN XL) 300 MG 24 hr tablet Take 1 tablet (300 mg total) by mouth daily. 30 tablet 5  . dapagliflozin propanediol (FARXIGA) 10 MG TABS tablet Take 10 mg by mouth daily. 30 tablet 11  . DULoxetine (CYMBALTA) 60 MG capsule Take 1 capsule (60 mg total) by mouth daily. 90 capsule 3  . Insulin Glargine (LANTUS SOLOSTAR) 100 UNIT/ML Solostar Pen Inject 40 Units into the skin 2 (two) times daily as needed. 5 pen 11  . Insulin Pen Needle (PEN NEEDLES) 30G X 8 MM MISC Inject 1 application into the skin 2 (two) times daily. 60 each 0  . lisinopril (PRINIVIL,ZESTRIL) 10 MG tablet Take 1 tablet (10 mg total) by mouth daily. 90 tablet 3  . rosuvastatin (CRESTOR) 10 MG tablet Take 10 mg by mouth daily.    . insulin lispro (HUMALOG) 100 UNIT/ML injection If sugar 150-200 take 2 units If sugar 201-251 take 4 units If sugar 251-300 take 6 units If sugar 301-350 take 8 units If sugar 351-400 take 10  units (Patient not taking: Reported on 06/12/2017) 10 mL 11   No facility-administered medications prior to visit.      ROS Review of Systems  Constitutional: Negative for chills, fever and malaise/fatigue.  Eyes: Positive for blurred vision. Negative for pain.  Respiratory: Negative for shortness of breath.   Cardiovascular: Negative for chest pain and palpitations.  Gastrointestinal: Negative for abdominal pain and nausea.  Genitourinary: Negative for dysuria and hematuria.  Musculoskeletal: Negative for joint pain and myalgias.  Skin: Negative for rash.  Neurological: Negative for tingling and headaches.  Psychiatric/Behavioral: Negative for depression. The patient is not nervous/anxious.     Objective:  BP 132/84 (BP Location: Left Arm, Patient Position: Sitting, Cuff Size: Large)   Pulse 92   Temp 98 F (36.7 C) (Oral)   Wt 187 lb 12.8 oz (85.2 kg)   SpO2 96%   BMI 31.74 kg/m   BP/Weight 06/12/2017 05/15/2017 04/26/2017  Systolic BP 132 148 147  Diastolic BP 84 79 77  Wt. (Lbs) 187.8 187 170  BMI 31.74 31.6 28.73      Physical Exam  Constitutional: She is oriented to person, place, and time.  Well developed, well nourished, NAD, polite  HENT:  Head: Normocephalic and atraumatic.  Mildly opaque pupils  Eyes: No scleral icterus.  Neck: Normal range of motion. Neck supple. No thyromegaly present.  Cardiovascular: Normal rate, regular rhythm and normal heart sounds.  Pulmonary/Chest:  Effort normal and breath sounds normal.  Musculoskeletal: She exhibits no edema.  Neurological: She is alert and oriented to person, place, and time. No cranial nerve deficit. Coordination normal.  Skin: Skin is warm and dry. No rash noted. No erythema. No pallor.  Psychiatric: She has a normal mood and affect. Her behavior is normal. Thought content normal.  Vitals reviewed.    Assessment & Plan:    1. Type 2 diabetes mellitus without complication, with long-term current use of  insulin (HCC) - Pneumococcal polysaccharide vaccine 23-valent greater than or equal to 2yo subcutaneous/IM - Continue Farxiga, Lantus, and Humalog as directed  2. Depression with anxiety - Continue on Cymbalta  3. Acute pain of right shoulder - naproxen (NAPROSYN) 500 MG tablet; Take 1 tablet (500 mg total) by mouth 2 (two) times daily with a meal.  Dispense: 30 tablet; Refill: 0  4. Screening for breast cancer - MM DIGITAL SCREENING BILATERAL; Future  5. Screening for HIV (human immunodeficiency virus) - HIV antibody  6. Encounter for hepatitis C screening test for low risk patient - Hepatitis c antibody (reflex)  7. Need for prophylactic vaccination against Streptococcus pneumoniae (pneumococcus) - Pneumococcal polysaccharide vaccine 23-valent greater than or equal to 2yo subcutaneous/IM   Meds ordered this encounter  Medications  . naproxen (NAPROSYN) 500 MG tablet    Sig: Take 1 tablet (500 mg total) by mouth 2 (two) times daily with a meal.    Dispense:  30 tablet    Refill:  0    Order Specific Question:   Supervising Provider    AnswerQuentin Angst:   JEGEDE, OLUGBEMIGA E [[id number]]    Follow-up: 4 weeks shoulder pain  Loletta Specteroger David Gayanne Prescott PA

## 2017-06-12 NOTE — Patient Instructions (Signed)
Shoulder Pain Many things can cause shoulder pain, including:  An injury.  Moving the arm in the same way again and again (overuse).  Joint pain (arthritis).  Follow these instructions at home: Take these actions to help with your pain:  Squeeze a soft ball or a foam pad as much as you can. This helps to prevent swelling. It also makes the arm stronger.  Take over-the-counter and prescription medicines only as told by your doctor.  If told, put ice on the area: ? Put ice in a plastic bag. ? Place a towel between your skin and the bag. ? Leave the ice on for 20 minutes, 2-3 times per day. Stop putting on ice if it does not help with the pain.  If you were given a shoulder sling or immobilizer: ? Wear it as told. ? Remove it to shower or bathe. ? Move your arm as little as possible. ? Keep your hand moving. This helps prevent swelling.  Contact a doctor if:  Your pain gets worse.  Medicine does not help your pain.  You have new pain in your arm, hand, or fingers. Get help right away if:  Your arm, hand, or fingers: ? Tingle. ? Are numb. ? Are swollen. ? Are painful. ? Turn white or blue. This information is not intended to replace advice given to you by your health care provider. Make sure you discuss any questions you have with your health care provider. Document Released: 08/28/2007 Document Revised: 11/05/2015 Document Reviewed: 07/04/2014 Elsevier Interactive Patient Education  2018 Elsevier Inc.  

## 2017-06-12 NOTE — Telephone Encounter (Signed)
[facility] regarding [facility]'s [facility] application since patient informed us that she was not approved based on her address. No answer. Left Toni AmendCourtney a message asking her to return my call at [phone].

## 2017-06-13 LAB — HEPATITIS C ANTIBODY (REFLEX): HCV Ab: <0.1 s/co ratio (ref 0.0–0.9)

## 2017-06-13 LAB — HIV ANTIBODY (ROUTINE TESTING W REFLEX): HIV SCREEN 4TH GENERATION: NONREACTIVE

## 2017-06-13 LAB — HCV COMMENT:

## 2017-06-16 ENCOUNTER — Telehealth (INDEPENDENT_AMBULATORY_CARE_PROVIDER_SITE_OTHER): Payer: Self-pay

## 2017-06-16 NOTE — Telephone Encounter (Signed)
-----   Message from Loletta Specteroger David Gomez, PA-C sent at 06/13/2017  6:46 PM EDT ----- HIV and HCV negative.

## 2017-06-16 NOTE — Telephone Encounter (Signed)
Patients sister was provided with negative HIV and HCV results will inform patient. Ann Pace S Trenity Pha, CMA

## 2017-06-19 MED FILL — !FARXIGA 10MG TABLET: 10 | 30 days supply | Qty: 30 | Fill #1

## 2017-06-19 MED FILL — ALLOPURINOL 300 MG TABS: 300 | 30 days supply | Qty: 30 | Fill #1

## 2017-06-19 MED FILL — DULoxetine HCL 60 MG CPEP: 60 | 30 days supply | Qty: 30 | Fill #1

## 2017-07-15 ENCOUNTER — Encounter (INDEPENDENT_AMBULATORY_CARE_PROVIDER_SITE_OTHER): Payer: Self-pay | Admitting: Physician Assistant

## 2017-07-15 ENCOUNTER — Ambulatory Visit (INDEPENDENT_AMBULATORY_CARE_PROVIDER_SITE_OTHER): Payer: Self-pay | Admitting: Physician Assistant

## 2017-07-15 ENCOUNTER — Other Ambulatory Visit: Payer: Self-pay

## 2017-07-15 VITALS — BP 112/73 | HR 101 | Temp 97.6°F | Ht 64.5 in | Wt 184.8 lb

## 2017-07-15 DIAGNOSIS — Z1211 Encounter for screening for malignant neoplasm of colon: Secondary | ICD-10-CM

## 2017-07-15 DIAGNOSIS — Z760 Encounter for issue of repeat prescription: Secondary | ICD-10-CM

## 2017-07-15 DIAGNOSIS — M25511 Pain in right shoulder: Secondary | ICD-10-CM

## 2017-07-15 DIAGNOSIS — R208 Other disturbances of skin sensation: Secondary | ICD-10-CM

## 2017-07-15 DIAGNOSIS — Z794 Long term (current) use of insulin: Secondary | ICD-10-CM

## 2017-07-15 DIAGNOSIS — E119 Type 2 diabetes mellitus without complications: Secondary | ICD-10-CM

## 2017-07-15 MED ORDER — ROSUVASTATIN CALCIUM 10 MG PO TABS: 10.0000 mg | ORAL_TABLET | Freq: Every day | ORAL | 3 refills | Status: DC

## 2017-07-15 MED ORDER — NAPROXEN 500 MG PO TABS: 500.0000 mg | ORAL_TABLET | Freq: Two times a day (BID) | ORAL | 0 refills | Status: DC

## 2017-07-15 MED FILL — NAPROXEN 500 MG TABLET: 500 | 20 days supply | Qty: 40 | Fill #0

## 2017-07-15 MED FILL — ROSUVASTATIN CALCIUM 10 MG: 10 | 30 days supply | Qty: 30 | Fill #0

## 2017-07-15 NOTE — Patient Instructions (Signed)

## 2017-07-15 NOTE — Progress Notes (Signed)
Subjective:  Patient ID: Ann Pace, female    DOB: 12-09-1955  Age: 62 y.o. MRN: [id number]  CC: f/u shoulder pain  HPI  Aquarius Gravitteis a 62 y.o.femalewith a medical history of DM2, peripheral neuropathy, bilateral knee arthroscopy and gout presents for management of right shoulder pain. Onset of pain waxing and waning over many years. Acute right shoulder pain onset one and a half months ago. Attributed to vacuuming heavy rugs. Stopped vacuuming and had relief of right shoulder pain. Naproxen 500 mg BID was also helpful in relieving pain.     Says glucose may not be under control. Has noted sugar readings to be at around 190s. Last A1c 7.6% two months ago. Does not diet or exercise. Drank a "cold orange Fanta" this morning. Had been to diabetic education class many years ago.     Outpatient Medications Prior to Visit  Medication Sig Dispense Refill  . allopurinol (ZYLOPRIM) 300 MG tablet Take 1 tablet (300 mg total) by mouth daily. 30 tablet 11  . buPROPion (WELLBUTRIN XL) 300 MG 24 hr tablet Take 1 tablet (300 mg total) by mouth daily. 30 tablet 5  . celecoxib (CELEBREX) 200 MG capsule Take 1 capsule (200 mg total) by mouth 2 (two) times daily. 60 capsule 2  . dapagliflozin propanediol (FARXIGA) 10 MG TABS tablet Take 10 mg by mouth daily. 30 tablet 11  . DULoxetine (CYMBALTA) 60 MG capsule Take 1 capsule (60 mg total) by mouth daily. 90 capsule 3  . Insulin Glargine (LANTUS SOLOSTAR) 100 UNIT/ML Solostar Pen Inject 40 units at bedtime and 50 units 12 hours after bedtime dose. 5 pen 11  . insulin lispro (HUMALOG KWIKPEN) 100 UNIT/ML KiwkPen If sugar 150-200 take 2 units If sugar 201-251 take 4 units If sugar 251-300 take 6 units If sugar 301-350 take 8 units If sugar 351-400 take 10 units 15 mL 11  . Insulin Pen Needle (PEN NEEDLES) 30G X 8 MM MISC Inject 1 application into the skin 5 (five) times daily. 150 each 11  . lisinopril (PRINIVIL,ZESTRIL) 10 MG tablet Take 1 tablet (10  mg total) by mouth daily. 90 tablet 3  . naproxen (NAPROSYN) 500 MG tablet Take 1 tablet (500 mg total) by mouth 2 (two) times daily with a meal. 40 tablet 0  . rosuvastatin (CRESTOR) 10 MG tablet Take 10 mg by mouth daily.     No facility-administered medications prior to visit.      ROS Review of Systems  Constitutional: Negative for chills, fever and malaise/fatigue.  Eyes: Negative for blurred vision.  Respiratory: Negative for shortness of breath.   Cardiovascular: Negative for chest pain and palpitations.  Gastrointestinal: Negative for abdominal pain and nausea.  Genitourinary: Negative for dysuria and hematuria.  Musculoskeletal: Positive for joint pain. Negative for myalgias.  Skin: Negative for rash.  Neurological: Negative for tingling and headaches.  Psychiatric/Behavioral: Negative for depression. The patient is not nervous/anxious.     Objective:  Ht 5' 4.5" (1.638 m)   Wt 184 lb 12.8 oz (83.8 kg)   BMI 31.23 kg/m   BP/Weight 07/15/2017 06/12/2017 05/15/2017  Systolic BP - 132 148  Diastolic BP - 84 79  Wt. (Lbs) 184.8 187.8 187  BMI 31.23 31.74 31.6      Physical Exam  Constitutional: She is oriented to person, place, and time.  Well developed, obese, NAD, polite  HENT:  Head: Normocephalic and atraumatic.  Eyes: No scleral icterus.  Neck: Normal range of motion. Neck supple. No  thyromegaly present.  Cardiovascular: Normal rate, regular rhythm and normal heart sounds.  Pulmonary/Chest: Effort normal and breath sounds normal.  Musculoskeletal: She exhibits no edema.  Neurological: She is alert and oriented to person, place, and time.  Loss of protective sensation of the foot bilaterally L > R  Skin: Skin is warm and dry. No rash noted. No erythema. No pallor.  Psychiatric: She has a normal mood and affect. Her behavior is normal. Thought content normal.  Vitals reviewed.    Assessment & Plan:    1. Acute pain of right shoulder - Refill naproxen  (NAPROSYN) 500 MG tablet; Take 1 tablet (500 mg total) by mouth 2 (two) times daily with a meal.  Dispense: 40 tablet; Refill: 0  2. Type 2 diabetes mellitus without complication, with long-term current use of insulin (HCC) - Referral to Nutrition and Diabetes Services  3. Loss of protective sensation of skin of foot - Awaiting CAFA approval before referral to neurology.   4. Screening for colon cancer - Fecal occult blood, imunochemical  5. Medication refill - Refill rosuvastatin (CRESTOR) 10 MG tablet; Take 1 tablet (10 mg total) by mouth daily.  Dispense: 90 tablet; Refill: 3   Meds ordered this encounter  Medications  . naproxen (NAPROSYN) 500 MG tablet    Sig: Take 1 tablet (500 mg total) by mouth 2 (two) times daily with a meal.    Dispense:  40 tablet    Refill:  0    Order Specific Question:   Supervising Provider    Answer:   Quentin AngstJEGEDE, OLUGBEMIGA E [id number]]  . rosuvastatin (CRESTOR) 10 MG tablet    Sig: Take 1 tablet (10 mg total) by mouth daily.    Dispense:  90 tablet    Refill:  3    Order Specific Question:   Supervising Provider    Answer:   Quentin AngstJEGEDE, OLUGBEMIGA E [id number]]    Follow-up: Return in about 4 months (around 11/14/2017) for DM and A1c.Loletta Specter.   Shandra Szymborski David Chantavia Bazzle PA

## 2017-07-16 MED FILL — $LANTUS SOLOSTAR 100 UNITS/: 100 | 30 days supply | Qty: 27 | Fill #0

## 2017-07-16 MED FILL — ALLOPURINOL 300 MG TABS: 300 | 30 days supply | Qty: 30 | Fill #2

## 2017-07-16 MED FILL — $FARXIGA 10 MG TABLET: 10 | 30 days supply | Qty: 30 | Fill #2

## 2017-07-16 MED FILL — DULoxetine HCL 60 MG CPEP: 60 | 30 days supply | Qty: 30 | Fill #2

## 2017-07-17 LAB — FECAL OCCULT BLOOD, IMMUNOCHEMICAL: Fecal Occult Bld: POSITIVE — AB

## 2017-07-18 ENCOUNTER — Other Ambulatory Visit (INDEPENDENT_AMBULATORY_CARE_PROVIDER_SITE_OTHER): Payer: Self-pay | Admitting: Physician Assistant

## 2017-07-18 ENCOUNTER — Telehealth (INDEPENDENT_AMBULATORY_CARE_PROVIDER_SITE_OTHER): Payer: Self-pay

## 2017-07-18 DIAGNOSIS — R195 Other fecal abnormalities: Secondary | ICD-10-CM

## 2017-07-18 NOTE — Telephone Encounter (Signed)
-----   Message from Loletta Specteroger David Gomez, PA-C sent at 07/18/2017  8:35 AM EDT ----- FIT positive. I will refer to GI for colonoscopy.

## 2017-07-18 NOTE — Telephone Encounter (Signed)
Patient is aware of positive FIT and referral placed to GI for colonoscopy. Patient states GI called today to schedule and she told them she had a colonoscopy at 7550, GI told her she would need to get those notes from the provider. Patient no longer sees that provider and does not have a way to contact the provider. Informed patient that since that was 10 years ago and her FIT was positive, call GI back and schedule the colonoscopy per PCP. Ann Pace S Akyla Vavrek, CMA

## 2017-08-08 MED FILL — BUPROPION HCL XL 300 MG TAB: 300 | 30 days supply | Qty: 30 | Fill #1

## 2017-08-26 ENCOUNTER — Encounter: Payer: Self-pay | Attending: Physician Assistant | Admitting: *Deleted

## 2017-08-26 DIAGNOSIS — Z713 Dietary counseling and surveillance: Secondary | ICD-10-CM | POA: Insufficient documentation

## 2017-08-26 DIAGNOSIS — E119 Type 2 diabetes mellitus without complications: Secondary | ICD-10-CM | POA: Insufficient documentation

## 2017-08-26 DIAGNOSIS — E1142 Type 2 diabetes mellitus with diabetic polyneuropathy: Secondary | ICD-10-CM

## 2017-08-26 DIAGNOSIS — Z794 Long term (current) use of insulin: Secondary | ICD-10-CM | POA: Insufficient documentation

## 2017-08-26 NOTE — Patient Instructions (Signed)
Plan:   Aim for 2-3 Carb Choices per meal (30-45 grams)    Aim for 0-1 Carbs per snack if hungry   Include protein in moderation with your meals and snacks  Consider reading food labels for Total Carbohydrate of foods  Consider  increasing your activity level by continuing to walk daily and consider adding Arm Chair Exercises for 15-30 minutes daily as tolerated  Consider checking BG at alternate times per day    Continue taking medication as directed by MD  We discussed the option of adding meal time insulin (Novolog) if OK with your MD. Adding meal time insulin before your meal will work with the food that you need to eat for adequate nutrition and would supplement what your body is not able to make enough of.

## 2017-08-27 NOTE — Progress Notes (Signed)
Diabetes Self-Management Education  Visit Type: First/Initial  Appt. Start Time: 1400 Appt. End Time: 1530  08/27/2017  [name], identified by name and date of birth, is a 62 y.o. female with a diagnosis of Diabetes: Type 2. Patient states she lost her husband 19 months ago. She also states she lost her job when she couldn't get approved for FMLA to allow for her to have cataract surgery, so she then lost her health insurance. She has been forced to move in with her parents due to her financial situation and they are elderly and require additional care too. So she feels trapped and frustrated. Her insulin types have been changed to to expense of [place], she is now on Lantus with Novolog as sliding scale. She is walking to mail box daily and does the cleaning of the family home. She gets very little sleep at night due to listening out for her father who falls frequently. She expresses little hope of her blood sugars improving based on changes in her eating habits as she feels they are fairly appropriate already.   ASSESSMENT  Height 5' 4.5" (1.638 m), weight 181 lb 14.4 oz (82.5 kg). Body mass index is 30.74 kg/m.  Diabetes Self-Management Education - 08/26/17 1403      Visit Information   Visit Type  First/Initial      Initial Visit   Diabetes Type  Type 2    Are you currently following a meal plan?  No    Are you taking your medications as prescribed?  Yes    Date Diagnosed  35 years ago      Health Coping   How would you rate your overall health?  Fair      Psychosocial Assessment   Patient Belief/Attitude about Diabetes  Other (comment)    Self-care barriers  Lack of material resources    Other persons present  Patient    Patient Concerns  Nutrition/Meal planning;Glycemic Control;Weight Control;Medication    Learning Readiness  Other (comment) Patient forced to move in with elderly parents due to financial problems, very stressful    How often do you need to have  someone help you when you read instructions, pamphlets, or other written materials from your doctor or pharmacy?  5 - Always    What is the last grade level you completed in school?  2 years college      Pre-Education Assessment   Patient understands the diabetes disease and treatment process.  Needs Review    Patient understands incorporating nutritional management into lifestyle.  Needs Review    Patient undertands incorporating physical activity into lifestyle.  Needs Review    Patient understands using medications safely.  Needs Instruction    Patient understands monitoring blood glucose, interpreting and using results  Needs Review    Patient understands prevention, detection, and treatment of acute complications.  Demonstrates understanding / competency    Patient understands prevention, detection, and treatment of chronic complications.  Demonstrates understanding / competency    Patient understands how to develop strategies to address psychosocial issues.  Needs Review    Patient understands how to develop strategies to promote health/change behavior.  Needs Review      Complications   Last HgB A1C per patient/outside source  7 %    How often do you check your blood sugar?  1-2 times/day    Fasting Blood glucose range (mg/dL)  161-096;>045    Postprandial Blood glucose range (mg/dL)  409-811;>914  Number of hypoglycemic episodes per month  0 not since came off of Tresiba    Have you had a dilated eye exam in the past 12 months?  Yes    Have you had a dental exam in the past 12 months?  No    Are you checking your feet?  Yes    How many days per week are you checking your feet?  7      Dietary Intake   Breakfast  1 toast, egg, 1-2 bacon, tomato slice    Snack (morning)  no    Lunch  1/2 bologna sandwich    Snack (afternoon)  not unless 3-4 crackers and 1 piece of cheese    Dinner  small bowl pinto beans, hamburger, sliced tomato, 1 piece corn bread    Snack (evening)  no     Beverage(s)  milk, diet lemondade, regular soda      Exercise   Exercise Type  Light (walking / raking leaves) walks daily, all house work for self and parents    How many days per week to you exercise?  7    How many minutes per day do you exercise?  30    Total minutes per week of exercise  210      Patient Education   Previous Diabetes Education  Yes (please comment) 35 years ago    Disease state   Factors that contribute to the development of diabetes    Nutrition management   Role of diet in the treatment of diabetes and the relationship between the three main macronutrients and blood glucose level;Carbohydrate counting;Meal timing in regards to the patients' current diabetes medication.;Food label reading, portion sizes and measuring food.;Reviewed blood glucose goals for pre and post meals and how to evaluate the patients' food intake on their blood glucose level.    Physical activity and exercise   Role of exercise on diabetes management, blood pressure control and cardiac health.    Medications  Reviewed patients medication for diabetes, action, purpose, timing of dose and side effects.    Monitoring  Identified appropriate SMBG and/or A1C goals.;Taught/discussed recording of test results and interpretation of SMBG.    Chronic complications  Identified and discussed with patient  current chronic complications    Psychosocial adjustment  Worked with patient to identify barriers to care and solutions;Role of stress on diabetes;Identified and addressed patients feelings and concerns about diabetes      Individualized Goals (developed by patient)   Nutrition  Follow meal plan discussed    Physical Activity  Exercise 3-5 times per week    Medications  take my medication as prescribed    Monitoring   test blood glucose pre and post meals as discussed      Post-Education Assessment   Patient understands the diabetes disease and treatment process.  Demonstrates understanding / competency     Patient understands incorporating nutritional management into lifestyle.  Demonstrates understanding / competency    Patient undertands incorporating physical activity into lifestyle.  Demonstrates understanding / competency    Patient understands using medications safely.  Demonstrates understanding / competency    Patient understands monitoring blood glucose, interpreting and using results  Demonstrates understanding / competency    Patient understands prevention, detection, and treatment of acute complications.  Demonstrates understanding / competency    Patient understands prevention, detection, and treatment of chronic complications.  Demonstrates understanding / competency    Patient understands how to develop strategies to address psychosocial issues.  Demonstrates understanding / competency    Patient understands how to develop strategies to promote health/change behavior.  Demonstrates understanding / competency      Outcomes   Expected Outcomes  Demonstrated interest in learning. Expect positive outcomes    Future DMSE  PRN    Program Status  Completed       Individualized Plan for Diabetes Self-Management Training:   Learning Objective:  Patient will have a greater understanding of diabetes self-management. Patient education plan is to attend individual and/or group sessions per assessed needs and concerns.   Plan:   I plan to provide her with information on [facility], which provides support for Care Givers.  Patient Instructions  Plan:   Aim for 2-3 Carb Choices per meal (30-45 grams)    Aim for 0-1 Carbs per snack if hungry   Include protein in moderation with your meals and snacks  Consider reading food labels for Total Carbohydrate of foods  Consider  increasing your activity level by continuing to walk daily and consider adding Arm Chair Exercises for 15-30 minutes daily as tolerated  Consider checking BG at alternate times per day    Continue taking  medication as directed by MD  We discussed the option of adding meal time insulin (Novolog) if OK with your MD. Adding meal time insulin before your meal will work with the food that you need to eat for adequate nutrition and would supplement what your body is not able to make enough of.   Expected Outcomes:  Demonstrated interest in learning. Expect positive outcomes  Education material provided: ADA Diabetes: Your Take Control Guide, Food label handouts, A1C conversion sheet, Meal plan card and Carbohydrate counting sheet Insulin Action Handout, Arm Chair Exercise handout  If problems or questions, patient to contact team via:  Phone  Future DSME appointment: within 3 months if patient is interested

## 2017-08-29 MED FILL — !HUMALOG 100 UNITS/ML KWIKP: 100 | 30 days supply | Qty: 9 | Fill #1

## 2017-08-29 MED FILL — $LANTUS SOLOSTAR 100 UNITS/: 100 | 30 days supply | Qty: 27 | Fill #1

## 2017-09-14 ENCOUNTER — Emergency Department (HOSPITAL_COMMUNITY)
Admission: EM | Admit: 2017-09-14 | Discharge: 2017-09-15 | Disposition: A | Discharge status: Home / Self Care | Payer: Self-pay | Attending: Emergency Medicine | Admitting: Emergency Medicine

## 2017-09-14 ENCOUNTER — Other Ambulatory Visit: Payer: Self-pay

## 2017-09-14 ENCOUNTER — Encounter (HOSPITAL_COMMUNITY): Payer: Self-pay | Admitting: Emergency Medicine

## 2017-09-14 DIAGNOSIS — Z79899 Other long term (current) drug therapy: Secondary | ICD-10-CM | POA: Insufficient documentation

## 2017-09-14 DIAGNOSIS — R739 Hyperglycemia, unspecified: Secondary | ICD-10-CM

## 2017-09-14 DIAGNOSIS — E1165 Type 2 diabetes mellitus with hyperglycemia: Secondary | ICD-10-CM | POA: Insufficient documentation

## 2017-09-14 DIAGNOSIS — Z794 Long term (current) use of insulin: Secondary | ICD-10-CM | POA: Insufficient documentation

## 2017-09-14 HISTORY — DX: Depression, unspecified: F32.A

## 2017-09-14 HISTORY — DX: Major depressive disorder, single episode, unspecified: F32.9

## 2017-09-14 LAB — BASIC METABOLIC PANEL
ANION GAP: 10 (ref 5–15)
BUN: 13 mg/dL (ref 6–20)
CALCIUM: 10 mg/dL (ref 8.9–10.3)
CO2: 28 mmol/L (ref 22–32)
Chloride: 95 mmol/L — ABNORMAL LOW (ref 101–111)
Creatinine, Ser: 1.05 mg/dL — ABNORMAL HIGH (ref 0.44–1.00)
GFR calc Af Amer: >60 mL/min (ref >60)
GFR, EST NON AFRICAN AMERICAN: 57 mL/min — AB (ref >60)
GLUCOSE: 697 mg/dL — AB (ref 65–99)
Potassium: 4.9 mmol/L (ref 3.5–5.1)
Sodium: 133 mmol/L — ABNORMAL LOW (ref 135–145)

## 2017-09-14 LAB — CBG MONITORING, ED

## 2017-09-14 LAB — CBC
HCT: 46.2 % — ABNORMAL HIGH (ref 36.0–46.0)
Hemoglobin: 14.7 g/dL (ref 12.0–15.0)
MCH: 28.4 pg (ref 26.0–34.0)
MCHC: 31.8 g/dL (ref 30.0–36.0)
MCV: 89.2 fL (ref 78.0–100.0)
PLATELETS: 249 10*3/uL (ref 150–400)
RBC: 5.18 MIL/uL — ABNORMAL HIGH (ref 3.87–5.11)
RDW: 12.8 % (ref 11.5–15.5)
WBC: 8.5 10*3/uL (ref 4.0–10.5)

## 2017-09-14 LAB — I-STAT VENOUS BLOOD GAS, ED
ACID-BASE EXCESS: 1 mmol/L (ref 0.0–2.0)
Bicarbonate: 27.4 mmol/L (ref 20.0–28.0)
O2 Saturation: 58 %
PH VEN: 7.36 (ref 7.250–7.430)
TCO2: 29 mmol/L (ref 22–32)
pCO2, Ven: 48.5 mmHg (ref 44.0–60.0)
pO2, Ven: 32 mmHg (ref 32.0–45.0)

## 2017-09-14 MED ORDER — INSULIN GLARGINE 100 UNIT/ML ~~LOC~~ SOLN
40.0000 [IU] | Freq: Once | SUBCUTANEOUS | Status: AC
  Administered 2017-09-15: 40 [IU] via SUBCUTANEOUS
  Filled 2017-09-14: qty 0.4

## 2017-09-14 MED ORDER — INSULIN ASPART 100 UNIT/ML ~~LOC~~ SOLN
10.0000 [IU] | Freq: Once | SUBCUTANEOUS | Status: AC
  Administered 2017-09-15: 10 [IU] via SUBCUTANEOUS
  Filled 2017-09-14: qty 1

## 2017-09-14 MED ORDER — SODIUM CHLORIDE 0.9 % IV BOLUS
1000.0000 mL | Freq: Once | INTRAVENOUS | Status: AC
  Administered 2017-09-15: 1000 mL via INTRAVENOUS

## 2017-09-14 NOTE — ED Triage Notes (Signed)
Hasn't felt well today.  CBG read over >600 just pta.  Reports increased thirst and urination x 3 days.

## 2017-09-14 NOTE — ED Provider Notes (Addendum)
[facility] EMERGENCY DEPARTMENT Provider Note  CSN: [id number] Arrival date & time: 09/14/17 2159  Chief Complaint(s) Hyperglycemia  HPI Ann Pace is a 62 y.o. female    Hyperglycemia  Blood sugar level PTA:  >300 Duration:  3 days Progression:  Waxing and waning Diabetes status:  Controlled with insulin Current diabetic therapy:  Lantus and Novolog Context comment:  Increased soft drink usage Relieved by:  Nothing Ineffective treatments:  Insulin Associated symptoms: fatigue, increased thirst, malaise and polyuria   Associated symptoms: no abdominal pain, no altered mental status, no chest pain, no confusion, no dehydration, no diaphoresis, no dysuria, no fever, no nausea, no shortness of breath and no vomiting     Past Medical History Past Medical History:  Diagnosis Date  . Depression   . Diabetes mellitus without complication [facility])    Patient Active Problem List   Diagnosis Date Noted  . Type 2 diabetes mellitus (HCC) 05/15/2017   Home Medication(s) Prior to Admission medications   Medication Sig Start Date End Date Taking? Authorizing Provider  allopurinol (ZYLOPRIM) 300 MG tablet Take 1 tablet (300 mg total) by mouth daily. 05/15/17   Loletta Specter, PA-C  buPROPion (WELLBUTRIN XL) 300 MG 24 hr tablet Take 1 tablet (300 mg total) by mouth daily. 05/15/17   Loletta Specter, PA-C  celecoxib (CELEBREX) 200 MG capsule Take 1 capsule (200 mg total) by mouth 2 (two) times daily. 06/12/17   Loletta Specter, PA-C  dapagliflozin propanediol (FARXIGA) 10 MG TABS tablet Take 10 mg by mouth daily. 05/15/17   Loletta Specter, PA-C  DULoxetine (CYMBALTA) 60 MG capsule Take 1 capsule (60 mg total) by mouth daily. 05/15/17   Loletta Specter, PA-C  Insulin Glargine (LANTUS SOLOSTAR) 100 UNIT/ML Solostar Pen Inject 40 units at bedtime and 50 units 12 hours after bedtime dose. 06/12/17   Loletta Specter, PA-C  insulin lispro (HUMALOG [facility]) 100  UNIT/ML KiwkPen If sugar 150-200 take 2 units If sugar 201-251 take 4 units If sugar 251-300 take 6 units If sugar 301-350 take 8 units If sugar 351-400 take 10 units 06/12/17   Loletta Specter, PA-C  Insulin Pen Needle (PEN NEEDLES) 30G X 8 MM MISC Inject 1 application into the skin 5 (five) times daily. 06/12/17   Loletta Specter, PA-C  lisinopril (PRINIVIL,ZESTRIL) 10 MG tablet Take 1 tablet (10 mg total) by mouth daily. 05/15/17   Loletta Specter, PA-C  naproxen (NAPROSYN) 500 MG tablet Take 1 tablet (500 mg total) by mouth 2 (two) times daily with a meal. 07/15/17   Loletta Specter, PA-C  rosuvastatin (CRESTOR) 10 MG tablet Take 1 tablet (10 mg total) by mouth daily. 07/15/17   Loletta Specter, PA-C  Past Surgical History Past Surgical History:  Procedure Laterality Date  . ABDOMINAL HYSTERECTOMY    . CARPAL TUNNEL RELEASE Right    Family History No family history on file.  Social History Social History   Tobacco Use  . Smoking status: Never Smoker  . Smokeless tobacco: Never Used  Substance Use Topics  . Alcohol use: No  . Drug use: No   Allergies Penicillins  Review of Systems Review of Systems  Constitutional: Positive for fatigue. Negative for diaphoresis and fever.  Respiratory: Negative for shortness of breath.   Cardiovascular: Negative for chest pain.  Gastrointestinal: Negative for abdominal pain, nausea and vomiting.  Endocrine: Positive for polydipsia and polyuria.  Genitourinary: Negative for dysuria.  Psychiatric/Behavioral: Negative for confusion.   All other systems are reviewed and are negative for acute change except as noted in the HPI  Physical Exam Vital Signs  I have reviewed the triage vital signs BP (!) 150/74   Pulse 89   Temp 98.1 F (36.7 C) (Oral)   Resp 17   Ht 5' 4.5" (1.638 m)   Wt 81.2 kg  (179 lb)   SpO2 96%   BMI 30.25 kg/m   Physical Exam  Constitutional: She is oriented to person, place, and time. She appears well-developed and well-nourished. No distress.  HENT:  Head: Normocephalic and atraumatic.  Nose: Nose normal.  Eyes: Pupils are equal, round, and reactive to light. Conjunctivae and EOM are normal. Right eye exhibits no discharge. Left eye exhibits no discharge. No scleral icterus.  Neck: Normal range of motion. Neck supple.  Cardiovascular: Normal rate and regular rhythm. Exam reveals no gallop and no friction rub.  No murmur heard. Pulmonary/Chest: Effort normal and breath sounds normal. No stridor. No respiratory distress. She has no rales.  Abdominal: Soft. She exhibits no distension. There is no tenderness.  Musculoskeletal: She exhibits no edema or tenderness.  Neurological: She is alert and oriented to person, place, and time.  Skin: Skin is warm and dry. No rash noted. She is not diaphoretic. No erythema.  Psychiatric: She has a normal mood and affect.  Vitals reviewed.   ED Results and Treatments Labs (all labs ordered are listed, but only abnormal results are displayed) Labs Reviewed  BASIC METABOLIC PANEL - Abnormal; Notable for the following components:      Result Value   Sodium 133 (*)    Chloride 95 (*)    Glucose, Bld 697 (*)    Creatinine, Ser 1.05 (*)    GFR calc non Af Amer 57 (*)    All other components within normal limits  CBC - Abnormal; Notable for the following components:   RBC 5.18 (*)    HCT 46.2 (*)    All other components within normal limits  URINALYSIS, ROUTINE W REFLEX MICROSCOPIC - Abnormal; Notable for the following components:   Color, Urine STRAW (*)    APPearance HAZY (*)    Glucose, UA >=500 (*)    Bacteria, UA RARE (*)    All other components within normal limits  CBG MONITORING, ED - Abnormal; Notable for the following components:   Glucose-Capillary >600 (*)    All other components within normal limits    CBG MONITORING, ED - Abnormal; Notable for the following components:   Glucose-Capillary >600 (*)    All other components within normal limits  CBG MONITORING, ED - Abnormal; Notable for the following components:   Glucose-Capillary 523 (*)    All other components within normal  limits  CBG MONITORING, ED - Abnormal; Notable for the following components:   Glucose-Capillary 457 (*)    All other components within normal limits  CBG MONITORING, ED - Abnormal; Notable for the following components:   Glucose-Capillary 364 (*)    All other components within normal limits  I-STAT VENOUS BLOOD GAS, ED                                                                                                                         EKG  EKG Interpretation  Date/Time:    Ventricular Rate:    PR Interval:    QRS Duration:   QT Interval:    QTC Calculation:   R Axis:     Text Interpretation:        Radiology No results found. Pertinent labs & imaging results that were available during my care of the patient were reviewed by me and considered in my medical decision making (see chart for details).  Medications Ordered in ED Medications  sodium chloride 0.9 % bolus 1,000 mL (0 mLs Intravenous Stopped 09/15/17 0118)  insulin glargine (LANTUS) injection 40 Units (40 Units Subcutaneous Given 09/15/17 0018)  insulin aspart (novoLOG) injection 10 Units (10 Units Subcutaneous Given 09/15/17 0018)  sodium chloride 0.9 % bolus 1,000 mL (0 mLs Intravenous Stopped 09/15/17 0301)  insulin aspart (novoLOG) injection 8 Units (8 Units Subcutaneous Given 09/15/17 0410)                                                                                                                                    Procedures Procedures CRITICAL CARE Performed by: Amadeo GarnetPedro Eduardo Cardama Total critical care time: 55 minutes Critical care time was exclusive of separately billable procedures and treating other patients. Critical care  was necessary to treat or prevent imminent or life-threatening deterioration. Critical care was time spent personally by me on the following activities: development of treatment plan with patient and/or surrogate as well as nursing, discussions with consultants, evaluation of patient's response to treatment, examination of patient, obtaining history from patient or surrogate, ordering and performing treatments and interventions, ordering and review of laboratory studies, ordering and review of radiographic studies, pulse oximetry and re-evaluation of patient's condition.   (including critical care time)  Medical Decision Making / ED Course I have reviewed the nursing notes for this encounter and the patient's prior records (if available in EHR  or on provided paperwork).    Hyperglycemia without evidence of DKA or HHS.  Patient was provided with IV and oral hydration as well as her nighttime Lantus and given 10 units of insulin.  Mild improvement in her hyperglycemia, and required additional 8 units of insulin. Repeat CBG Improved.  The patient appears reasonably screened and/or stabilized for discharge and I doubt any other medical condition or other [facility] requiring further screening, evaluation, or treatment in the ED at this time prior to discharge.  The patient is safe for discharge with strict return precautions.     Final Clinical Impression(s) / ED Diagnoses Final diagnoses:  Hyperglycemia   Disposition: Discharge  Condition: Good  I have discussed the results, Dx and Tx plan with the patient who expressed understanding and agree(s) with the plan. Discharge instructions discussed at great length. The patient was given strict return precautions who verbalized understanding of the instructions. No further questions at time of discharge.    ED Discharge Orders    None       Follow Up: Loletta Specter, PA-C [address] [place] [address] [phone]   in 3-5  days for DM insulin management      This chart was dictated using voice recognition software.  Despite best efforts to proofread,  errors can occur which can change the documentation meaning.   Nira Conn, MD 09/15/17 9147    Nira Conn, MD 09/29/17 [phone]

## 2017-09-15 ENCOUNTER — Telehealth (INDEPENDENT_AMBULATORY_CARE_PROVIDER_SITE_OTHER): Payer: Self-pay | Admitting: Physician Assistant

## 2017-09-15 LAB — CBG MONITORING, ED
GLUCOSE-CAPILLARY: 523 mg/dL — AB (ref 65–99)
Glucose-Capillary: 457 mg/dL — ABNORMAL HIGH (ref 65–99)
Glucose-Capillary: 364 mg/dL — ABNORMAL HIGH (ref 65–99)

## 2017-09-15 LAB — URINALYSIS, ROUTINE W REFLEX MICROSCOPIC
BILIRUBIN URINE: NEGATIVE
Glucose, UA: >=500 mg/dL — AB
Hgb urine dipstick: NEGATIVE
Ketones, ur: NEGATIVE mg/dL
LEUKOCYTES UA: NEGATIVE
NITRITE: NEGATIVE
PH: 7 (ref 5.0–8.0)
Protein, ur: NEGATIVE mg/dL
SPECIFIC GRAVITY, URINE: 1.025 (ref 1.005–1.030)

## 2017-09-15 MED ORDER — SODIUM CHLORIDE 0.9 % IV BOLUS
1000.0000 mL | Freq: Once | INTRAVENOUS | Status: AC
  Administered 2017-09-15: 1000 mL via INTRAVENOUS

## 2017-09-15 MED ORDER — INSULIN ASPART 100 UNIT/ML ~~LOC~~ SOLN
8.0000 [IU] | Freq: Once | SUBCUTANEOUS | Status: AC
  Administered 2017-09-15: 8 [IU] via SUBCUTANEOUS
  Filled 2017-09-15: qty 1

## 2017-09-15 NOTE — Telephone Encounter (Signed)
Please make sure she has an appointment to f/u with me. Thanks

## 2017-09-15 NOTE — ED Notes (Signed)
Pt understood dc material. NAD noted. 

## 2017-09-15 NOTE — Telephone Encounter (Signed)
Patient called stating that she went to the ER yesterday and her blood sugar was 670 . Has noticed that she has been more thirsty than usual within the last 3 days. Patient woke up around 12:15 this morning due to being discharged at 5:30 am from the hospital and she measured her Glucose blood and it was at 316, she also states that she took 50 units ( Insulin Glargine (LANTUS SOLOSTAR) 100 UNIT/ML Solostar )  and 8 units ( insulin lispro (HUMALOG KWIKPEN) 100 UNIT/ML KiwkPen ) . Patients states that she was told to get in touch with her PCP. She also states that she has seen diabatic educator that PCP suggested to see. Patient states she went on June the 4.  Please Follow Up [phone]  Thank you

## 2017-09-30 MED FILL — $LANTUS SOLOSTAR 100 UNITS/: 100 | 30 days supply | Qty: 27 | Fill #2

## 2017-09-30 MED FILL — ALLOPURINOL 300 MG TABLET: 300 | 30 days supply | Qty: 30 | Fill #3

## 2017-09-30 MED FILL — BUPROPION HCL XL 300 MG TAB: 300 | 30 days supply | Qty: 30 | Fill #2

## 2017-09-30 MED FILL — $HUMALOG 100 UNITS/ML KWIKP: 100 | 30 days supply | Qty: 9 | Fill #2

## 2017-09-30 MED FILL — LISINOPRIL 10 MG TABS: 10 | 30 days supply | Qty: 30 | Fill #1

## 2017-09-30 MED FILL — $CYMBALTA 60 MG CAPSULE: 60 | 30 days supply | Qty: 30 | Fill #3

## 2017-09-30 MED FILL — $FARXIGA 10 MG TABLET: 10 | 30 days supply | Qty: 30 | Fill #3

## 2017-10-03 MED FILL — CELECOXIB 200 MG CAPSULE: 200 | 30 days supply | Qty: 60 | Fill #1

## 2017-10-06 NOTE — Telephone Encounter (Signed)
Patient has appointment 10-07-17 @2 :50. Thank You Louisa SecondGloria I Vargas Hernandez

## 2017-10-07 ENCOUNTER — Encounter (INDEPENDENT_AMBULATORY_CARE_PROVIDER_SITE_OTHER): Payer: Self-pay | Admitting: Physician Assistant

## 2017-10-07 ENCOUNTER — Telehealth (INDEPENDENT_AMBULATORY_CARE_PROVIDER_SITE_OTHER): Payer: Self-pay | Admitting: Physician Assistant

## 2017-10-07 ENCOUNTER — Other Ambulatory Visit (INDEPENDENT_AMBULATORY_CARE_PROVIDER_SITE_OTHER): Payer: Self-pay | Admitting: Physician Assistant

## 2017-10-07 ENCOUNTER — Other Ambulatory Visit: Payer: Self-pay

## 2017-10-07 ENCOUNTER — Ambulatory Visit (INDEPENDENT_AMBULATORY_CARE_PROVIDER_SITE_OTHER): Payer: Self-pay | Admitting: Physician Assistant

## 2017-10-07 VITALS — BP 104/71 | HR 95 | Temp 98.1°F | Ht 64.5 in | Wt 181.4 lb

## 2017-10-07 DIAGNOSIS — R195 Other fecal abnormalities: Secondary | ICD-10-CM

## 2017-10-07 DIAGNOSIS — Z23 Encounter for immunization: Secondary | ICD-10-CM

## 2017-10-07 DIAGNOSIS — E1142 Type 2 diabetes mellitus with diabetic polyneuropathy: Secondary | ICD-10-CM

## 2017-10-07 DIAGNOSIS — Z794 Long term (current) use of insulin: Secondary | ICD-10-CM

## 2017-10-07 LAB — POCT GLYCOSYLATED HEMOGLOBIN (HGB A1C): Hemoglobin A1C: 9.4 % — AB (ref 4.0–5.6)

## 2017-10-07 MED ORDER — ACCU-CHEK SOFT TOUCH LANCETS MISC: 12 refills | Status: AC

## 2017-10-07 MED ORDER — GABAPENTIN 300 MG PO CAPS: 300.0000 mg | ORAL_CAPSULE | Freq: Every day | ORAL | 11 refills | Status: DC

## 2017-10-07 MED ORDER — GLUCOSE BLOOD VI STRP: ORAL_STRIP | 12 refills | Status: DC

## 2017-10-07 MED ORDER — INSULIN GLARGINE 100 UNIT/ML SOLOSTAR PEN: PEN_INJECTOR | SUBCUTANEOUS | 11 refills | Status: DC

## 2017-10-07 MED ORDER — ACCU-CHEK AVIVA PLUS W/DEVICE KIT: 1.0000 | PACK | Freq: Every day | 0 refills | Status: AC

## 2017-10-07 MED FILL — GABAPENTIN 300 MG CAPSULE: 300 | 30 days supply | Qty: 30 | Fill #0

## 2017-10-07 NOTE — Progress Notes (Signed)
Subjective:  Patient ID: Ann Pace, female    DOB: 06-Jun-1955  Age: 62 y.o. MRN: [id number]  CC: f/u hyperglycemia  HPI Orel Gravitteis a 62 y.o.femalewith a medical history of DM2, peripheral neuropathy, bilateral knee arthroscopy and gout presents to f/u on ED visit for hyperglycemia. Went to ED on 09/14/17, was not in DKA or HHS. Last A1c 7.6% nearly five months ago. A1c 9.4%. Pt has been increasing insulin glargine since her ED visit and now on 50 units BID. Continues to take sliding scale insulin and Farxiga. Endorses tingling of the left foot that disturbs her sleep. Does not endorse any other symptom or complaint.    Pt was found to have a positive FIT test. Pt has not completed CAFA process yet. Can not have colonoscopy after her positive FIT testing due to lack of funds/insurance. Says she will call to find out what happened to her application.      Outpatient Medications Prior to Visit  Medication Sig Dispense Refill  . allopurinol (ZYLOPRIM) 300 MG tablet Take 1 tablet (300 mg total) by mouth daily. 30 tablet 11  . buPROPion (WELLBUTRIN XL) 300 MG 24 hr tablet Take 1 tablet (300 mg total) by mouth daily. 30 tablet 5  . celecoxib (CELEBREX) 200 MG capsule Take 1 capsule (200 mg total) by mouth 2 (two) times daily. 60 capsule 2  . dapagliflozin propanediol (FARXIGA) 10 MG TABS tablet Take 10 mg by mouth daily. 30 tablet 11  . DULoxetine (CYMBALTA) 60 MG capsule Take 1 capsule (60 mg total) by mouth daily. 90 capsule 3  . Insulin Glargine (LANTUS SOLOSTAR) 100 UNIT/ML Solostar Pen Inject 40 units at bedtime and 50 units 12 hours after bedtime dose. 5 pen 11  . insulin lispro (HUMALOG KWIKPEN) 100 UNIT/ML KiwkPen If sugar 150-200 take 2 units If sugar 201-251 take 4 units If sugar 251-300 take 6 units If sugar 301-350 take 8 units If sugar 351-400 take 10 units 15 mL 11  . Insulin Pen Needle (PEN NEEDLES) 30G X 8 MM MISC Inject 1 application into the skin 5 (five) times daily.  150 each 11  . lisinopril (PRINIVIL,ZESTRIL) 10 MG tablet Take 1 tablet (10 mg total) by mouth daily. 90 tablet 3  . naproxen (NAPROSYN) 500 MG tablet Take 1 tablet (500 mg total) by mouth 2 (two) times daily with a meal. 40 tablet 0  . rosuvastatin (CRESTOR) 10 MG tablet Take 1 tablet (10 mg total) by mouth daily. 90 tablet 3   No facility-administered medications prior to visit.      ROS Review of Systems  Constitutional: Negative for chills, fever and malaise/fatigue.  Eyes: Negative for blurred vision.  Respiratory: Negative for shortness of breath.   Cardiovascular: Negative for chest pain and palpitations.  Gastrointestinal: Negative for abdominal pain and nausea.  Genitourinary: Negative for dysuria and hematuria.  Musculoskeletal: Negative for joint pain and myalgias.  Skin: Negative for rash.  Neurological: Negative for tingling and headaches.  Psychiatric/Behavioral: Negative for depression. The patient is not nervous/anxious.     Objective:  BP 104/71 (BP Location: Left Arm, Patient Position: Sitting, Cuff Size: Large)   Pulse 95   Temp 98.1 F (36.7 C) (Oral)   Ht 5' 4.5" (1.638 m)   Wt 181 lb 6.4 oz (82.3 kg)   SpO2 93%   BMI 30.66 kg/m   BP/Weight 10/07/2017 09/15/2017 09/14/2017  Systolic BP 104 151 -  Diastolic BP 71 83 -  Wt. (Lbs) 181.4 - 179  BMI 30.66 - 30.25      Physical Exam  Constitutional: She is oriented to person, place, and time.  Well developed, well nourished, NAD, polite  HENT:  Head: Normocephalic and atraumatic.  Eyes: No scleral icterus.  Neck: Normal range of motion. Neck supple. No thyromegaly present.  Cardiovascular: Normal rate, regular rhythm and normal heart sounds.  Pulmonary/Chest: Effort normal and breath sounds normal.  Abdominal: Soft. Bowel sounds are normal. There is no tenderness.  Musculoskeletal: She exhibits no edema.  Neurological: She is alert and oriented to person, place, and time.  Skin: Skin is warm and dry.  No rash noted. No erythema. No pallor.  Psychiatric: She has a normal mood and affect. Her behavior is normal. Thought content normal.  Vitals reviewed.    Assessment & Plan:    1. Type 2 diabetes mellitus with diabetic polyneuropathy, with long-term current use of insulin (HCC) - HgB A1c 9.3% - Increase Insulin Glargine (LANTUS SOLOSTAR) 100 UNIT/ML Solostar Pen; Inject 50 units at bedtime and 50 units 12 hours after bedtime dose.  Dispense: 10 pen; Refill: 11 - Begin gabapentin (NEURONTIN) 300 MG capsule; Take 1 capsule (300 mg total) by mouth at bedtime.  Dispense: 30 capsule; Refill: 11 - Continue on sliding scale insulin and Farxiga  2. Need for Tdap vaccination - Tdap vaccine greater than or equal to 7yo IM  3. Positive fecal immunochemical test - Pt is aware that she will need a colonoscopy to rule out colon cancer but she is still waiting on decision of her CAFA application.     Meds ordered this encounter  Medications  . Insulin Glargine (LANTUS SOLOSTAR) 100 UNIT/ML Solostar Pen    Sig: Inject 50 units at bedtime and 50 units 12 hours after bedtime dose.    Dispense:  10 pen    Refill:  11    ICD 10 - E11.10    Order Specific Question:   Supervising Provider    Answer:   Hoy RegisterNEWLIN, ENOBONG [[id number]]  . gabapentin (NEURONTIN) 300 MG capsule    Sig: Take 1 capsule (300 mg total) by mouth at bedtime.    Dispense:  30 capsule    Refill:  11    Order Specific Question:   Supervising Provider    Answer:   Hoy RegisterNEWLIN, ENOBONG [[id number]]    Follow-up: Return in about 3 months (around 01/07/2018) for Diabetes.   Loletta Specteroger David Jachai Okazaki PA

## 2017-10-07 NOTE — Telephone Encounter (Signed)
Patient called to inform PCP that she is currently using [employer] diabetes meter.  Please follow up [phone]  Thank you Louisa SecondGloria I Vargas Hernandez

## 2017-10-07 NOTE — Telephone Encounter (Signed)
This is not available. I have written for a new glucometer, strips, and lancets. Sent to [facility].

## 2017-10-07 NOTE — Patient Instructions (Signed)
Ms. Lucilla LameBrooke McCauley will be calling you to obtain information for your free PAP and mammogram screenings.

## 2017-10-08 MED FILL — !TRUE METRIX BLOOD GLUCOSE: 365 days supply | Qty: 1 | Fill #0

## 2017-10-08 MED FILL — TRUEplus LANCETS 28G MISC: 30 days supply | Qty: 100 | Fill #0

## 2017-10-08 MED FILL — TRUE METRIX TEST STRIP: 30 days supply | Qty: 100 | Fill #0

## 2017-10-08 NOTE — Telephone Encounter (Signed)
Patient is aware. Ann Pace S Ardean Simonich, CMA  

## 2017-10-14 ENCOUNTER — Other Ambulatory Visit: Payer: Self-pay | Admitting: Obstetrics and Gynecology

## 2017-10-14 DIAGNOSIS — Z1231 Encounter for screening mammogram for malignant neoplasm of breast: Secondary | ICD-10-CM

## 2017-10-30 ENCOUNTER — Ambulatory Visit (INDEPENDENT_AMBULATORY_CARE_PROVIDER_SITE_OTHER): Payer: Self-pay | Admitting: Physician Assistant

## 2017-11-14 MED FILL — LISINOPRIL 10 MG TABS: 10 | 30 days supply | Qty: 30 | Fill #2

## 2017-11-14 MED FILL — $CYMBALTA 60 MG CAPSULE: 60 | 90 days supply | Qty: 90 | Fill #4

## 2017-11-14 MED FILL — $FARXIGA 10 MG TABLET: 10 | 30 days supply | Qty: 30 | Fill #4

## 2017-11-14 MED FILL — BUPROPION HCL XL 300 MG TAB: 300 | 30 days supply | Qty: 30 | Fill #3

## 2017-11-14 MED FILL — ALLOPURINOL 300 MG TAB: 300 | 30 days supply | Qty: 30 | Fill #4

## 2017-11-19 MED FILL — $LANTUS SOLOSTAR 100 UNITS/: 100 | 30 days supply | Qty: 30 | Fill #0

## 2017-11-21 ENCOUNTER — Ambulatory Visit: Payer: Self-pay | Attending: Physician Assistant

## 2017-11-26 ENCOUNTER — Other Ambulatory Visit (HOSPITAL_COMMUNITY): Payer: Self-pay | Admitting: *Deleted

## 2017-11-26 ENCOUNTER — Inpatient Hospital Stay: Payer: Self-pay | Attending: Obstetrics and Gynecology | Admitting: *Deleted

## 2017-11-26 ENCOUNTER — Inpatient Hospital Stay: Payer: Self-pay

## 2017-11-26 VITALS — BP 130/70 | Ht 64.0 in | Wt 183.0 lb

## 2017-11-26 DIAGNOSIS — Z0000 Encounter for general adult medical examination without abnormal findings: Secondary | ICD-10-CM

## 2017-11-26 LAB — HEMOGLOBIN A1C
Hgb A1c MFr Bld: 7.6 % — ABNORMAL HIGH (ref 4.8–5.6)
Mean Plasma Glucose: 171.42 mg/dL

## 2017-11-26 LAB — LIPID PANEL
CHOLESTEROL: 321 mg/dL — AB (ref 0–200)
HDL: 30 mg/dL — ABNORMAL LOW (ref >40)
LDL Cholesterol: UNDETERMINED mg/dL (ref 0–99)
Total CHOL/HDL Ratio: 10.7 RATIO
Triglycerides: 450 mg/dL — ABNORMAL HIGH (ref <150)
VLDL: UNDETERMINED mg/dL (ref 0–40)

## 2017-11-26 NOTE — Addendum Note (Signed)
Addended by: Alysia Penna on: 11/26/2017 02:21 PM   Modules accepted: Orders

## 2017-11-26 NOTE — Progress Notes (Signed)
Wisewoman initial screening  Clinical Measurement:  Height: 64in Weight: 183lb Blood Pressure: 130/78 Blood Pressure #2: 130/70  Fasting Labs Drawn Today, will review with patient when they result.  Medical History:  Patient states that she has been diagnosed with high cholesterol, and diabetes, but not diagnosed with high blood pressure,  or heart disease.  Medications:  Patients states she is  taking  medications for high cholesterol,  and diabetes.  Patient states she is not taking high blood pressure medication to lower her blood pressure.  She is not taking aspirin daily to prevent heart attack or stroke.    Blood pressure, self measurement:  Patients states she does not measure blood pressure at home.    Nutrition:  Patient states she eats 1 cup of fruit and 0.5 cups of vegetables in an average day.  Patient states she does not eat fish regularly, she eats about a  half a serving of whole grains daily. She drinks less than 36 ounces of beverages with added sugar weekly.  She is currently watching her sodium intake.  She has not had any drinks containing alcohol in the last seven days.    Physical activity:  Patient states that she gets 90 minutes of moderate exercise in a week.  She gets 0 minutes of vigorous exercise per week.    Smoking status:  Patient states she has never smoked and is not around any smokers.    Quality of life:  Patient states that she has had 0 bad physical days out of the last 30 days. In the last 2 weeks, she has had several days that she has felt down or depressed. She has had 4 days in the last 2 weeks that she has had little interest or pleasure in doing things.  Risk reduction and counseling:  Patient states she wants to lose weight and increase fruit and vegetable intake.  I encouraged her to continue with current exercise regimen and increase vegetable and fruit intake.  Navigation:  I will notify patient of lab results.  Patient is aware of 2 more health  coaching sessions and a follow up.

## 2017-11-27 ENCOUNTER — Telehealth (HOSPITAL_COMMUNITY): Payer: Self-pay | Admitting: *Deleted

## 2017-11-27 ENCOUNTER — Encounter (HOSPITAL_COMMUNITY): Payer: Self-pay

## 2017-11-27 ENCOUNTER — Ambulatory Visit (HOSPITAL_COMMUNITY)
Admission: RE | Admit: 2017-11-27 | Discharge: 2017-11-27 | Disposition: A | Discharge status: Home / Self Care | Payer: Self-pay | Source: Ambulatory Visit | Attending: Obstetrics and Gynecology | Admitting: Obstetrics and Gynecology

## 2017-11-27 ENCOUNTER — Telehealth (INDEPENDENT_AMBULATORY_CARE_PROVIDER_SITE_OTHER): Payer: Self-pay | Admitting: Physician Assistant

## 2017-11-27 ENCOUNTER — Other Ambulatory Visit (INDEPENDENT_AMBULATORY_CARE_PROVIDER_SITE_OTHER): Payer: Self-pay | Admitting: Physician Assistant

## 2017-11-27 ENCOUNTER — Ambulatory Visit
Admission: RE | Admit: 2017-11-27 | Discharge: 2017-11-27 | Disposition: A | Discharge status: Home / Self Care | Payer: Self-pay | Source: Ambulatory Visit | Attending: Obstetrics and Gynecology | Admitting: Obstetrics and Gynecology

## 2017-11-27 VITALS — BP 144/76 | Ht 64.0 in | Wt 181.8 lb

## 2017-11-27 DIAGNOSIS — Z1231 Encounter for screening mammogram for malignant neoplasm of breast: Secondary | ICD-10-CM

## 2017-11-27 DIAGNOSIS — E781 Pure hyperglyceridemia: Secondary | ICD-10-CM

## 2017-11-27 DIAGNOSIS — Z1239 Encounter for other screening for malignant neoplasm of breast: Secondary | ICD-10-CM

## 2017-11-27 MED ORDER — ROSUVASTATIN CALCIUM 20 MG PO TABS: 20.0000 mg | ORAL_TABLET | Freq: Every day | ORAL | 1 refills | Status: DC

## 2017-11-27 MED ORDER — OMEGA-3-ACID ETHYL ESTERS 1 G PO CAPS: 2.0000 g | ORAL_CAPSULE | Freq: Two times a day (BID) | ORAL | 1 refills | Status: DC

## 2017-11-27 MED FILL — OMEGA-3 ETHYL ESTERS 1 GM C: 1 | 30 days supply | Qty: 120 | Fill #0

## 2017-11-27 MED FILL — ROSUVASTATIN CALCIUM 20 MG: 20 | 30 days supply | Qty: 30 | Fill #0

## 2017-11-27 NOTE — Progress Notes (Signed)
No complaints today.   Pap Smear: Pap smear not completed today. Last Pap smear was 20 years ago and normal per patient. Per patient has no history of an abnormal Pap smear. Patient has a history of a hysterectomy 31 years ago due to endometriosis. Patient doesn't need any further Pap smears due to her history of a hysterectomy for benign reasons per BCCCP and ACOG guidelines. No Pap smear results are in Epic.  Physical exam: Breasts Breasts symmetrical. No skin abnormalities bilateral breasts. No nipple retraction bilateral breasts. No nipple discharge bilateral breasts. No lymphadenopathy. No lumps palpated bilateral breasts. No complaints of pain or tenderness on exam. Referred patient to the [facility] for a screening mammogram. Appointment scheduled for Thursday, November 27, 2017 at 1440.        Pelvic/Bimanual No Pap smear completed today since patient has a history of a hysterectomy for benign reasons. Pap smear not indicated per BCCCP guidelines.   Smoking History: Patient has never smoked.  Patient Navigation: Patient education provided. Access to services provided for patient through BCCCP program.   Colorectal Cancer Screening: Per patient had a colonoscopy completed 10 years ago. Patient completed a FIT Test 07/21/2017 that was positive. PCP aware and has discussed with patient. Patients PCP is working on referral for follow-up. No complaints today.   Breast and Cervical Cancer Risk Assessment: Patient has no family history of breast cancer, known genetic mutations, or radiation treatment to the chest before age 39. Patient has no history of cervical dysplasia, immunocompromised, or DES exposure in-utero.  Risk Assessment    Risk Scores      11/27/2017   Last edited by: Lynnell Dike, LPN   5-year risk: 1.8 %   Lifetime risk: 8.9 %

## 2017-11-27 NOTE — Patient Instructions (Addendum)
Explained breast self awareness with [name]. Patient did not need a Pap smear today due to patient has a history of a hysterectomy for benign reasons. Let patient know that she doesn't need any further Pap smears due to her history of a hysterectomy for benign reasons. Referred patient to the [facility] for a screening mammogram. Appointment scheduled for Thursday, November 27, 2017 at 1440. Let patient know the Breast Center will follow up with her within the next couple weeks with results of mammogram by letter or phone. Discussed with patient her Wisewoman results. Explained to patient that her cholesterol and triglycerides were elevated. [name] has called patients PCP and notified of results. Patients PCP has sent a prescription to her pharmacy and has made a follow-up appointment in October 2019. [name] informed patient of recommended follow-up. Discussed with patient that her hgb AIC was elevated. Patient is a diabetic and is being followed by her PCP. Discussed with patient a low-carb diet. [name] verbalized understanding.  [name], [name], RN 2:12 PM

## 2017-11-27 NOTE — Telephone Encounter (Signed)
opened in error

## 2017-11-27 NOTE — Telephone Encounter (Signed)
Referral  Labs- hemoglobin A1C 7.6,cholesterol 321, triglycerides 450, HDL cholesterol 30  In boxed the patient's provider(Dr. Lily Kocher of [facility]) to make him aware of the patient's abnormal lab results , also spoke to [facility] to set up a follow up appointment regarding these results.  [facility] said the patient would be seen in early October and that they had been trying to reach the patient via telephone to let her know that they had sent medications to her pharmacy.  Patient was seen at a B[facility]CP appointment today(Sept. [phone]) and was informed of lab results and attempted notification from [facility] regarding medications.

## 2017-11-28 ENCOUNTER — Telehealth (INDEPENDENT_AMBULATORY_CARE_PROVIDER_SITE_OTHER): Payer: Self-pay

## 2017-11-28 ENCOUNTER — Encounter (HOSPITAL_COMMUNITY): Payer: Self-pay | Admitting: *Deleted

## 2017-11-28 NOTE — Telephone Encounter (Signed)
Patient aware that cholesterol is still elevated and lovaza has been added to medication regimen and crestor has been increased.pick up at [facility] pharmacy. Maryjean Morn, CMA

## 2017-11-28 NOTE — Telephone Encounter (Signed)
-----   Message from Loletta Specter, PA-C sent at 11/27/2017  1:53 PM EDT ----- Hello, thank you for helping this patient. Your A1c was done too early in relation to the one we did but at least she is better than before. Thank you for the lipid panel. I have sent Lovaza and increased her Crestor. Tempestt Su Hilt CMA will be calling patient to pick up medications at [facility] pharmacy. Have a nice day.   Sindy Messing PA-C    ----- Message ----- From: Arlean Hopping, LPN Sent: 04/29/4980  12:09 PM EDT To: Loletta Specter, PA-C  Dr. Lily Kocher,    Mrs. Beckstead was seen yesterday at [facility].  Her labs (hemoglobin A1C and lipid panel) were drawn and resulted today. The results are in Epic. The results were abnormal and she needs follow up based on these results.   [name] [employer]

## 2017-12-03 MED FILL — TRUE METRIX TEST STRIP: 30 days supply | Qty: 100 | Fill #1

## 2017-12-03 MED FILL — TRUEplus LANCETS 28G MISC: 30 days supply | Qty: 100 | Fill #1

## 2017-12-29 ENCOUNTER — Other Ambulatory Visit (INDEPENDENT_AMBULATORY_CARE_PROVIDER_SITE_OTHER): Payer: Self-pay | Admitting: Physician Assistant

## 2017-12-29 DIAGNOSIS — M25511 Pain in right shoulder: Secondary | ICD-10-CM

## 2017-12-29 MED FILL — LISINOPRIL 10 MG TABS: 10 | 30 days supply | Qty: 30 | Fill #3

## 2017-12-29 MED FILL — GABAPENTIN 300 MG CAPSULE: 300 | 30 days supply | Qty: 30 | Fill #1

## 2017-12-29 MED FILL — $LANTUS SOLOSTAR 100 UNITS/: 100 | 30 days supply | Qty: 30 | Fill #1

## 2017-12-29 MED FILL — CELECOXIB 200 MG CAP: 200 | 30 days supply | Qty: 60 | Fill #2

## 2017-12-29 MED FILL — ALLOPURINOL 300 MG TAB: 300 | 30 days supply | Qty: 30 | Fill #5

## 2017-12-29 MED FILL — ROSUVASTATIN CALCIUM 20 MG: 20 | 30 days supply | Qty: 30 | Fill #1

## 2017-12-29 MED FILL — FARXIGA 10 MG TABLET: 10 | 30 days supply | Qty: 30 | Fill #5

## 2017-12-29 MED FILL — $HUMALOG 100 UNITS/ML KWIKP: 100 | 90 days supply | Qty: 27 | Fill #3

## 2017-12-29 MED FILL — OMEGA-3 ETHYL ESTERS 1 GM C: 1 | 30 days supply | Qty: 120 | Fill #1

## 2017-12-29 NOTE — Telephone Encounter (Signed)
FWD to PCP. Tempestt S Roberts, CMA  

## 2017-12-30 MED FILL — TRUE METRIX TEST STRIP: 30 days supply | Qty: 100 | Fill #2

## 2018-01-07 ENCOUNTER — Ambulatory Visit (INDEPENDENT_AMBULATORY_CARE_PROVIDER_SITE_OTHER): Payer: PRIVATE HEALTH INSURANCE | Admitting: Physician Assistant

## 2018-01-08 DIAGNOSIS — E1142 Type 2 diabetes mellitus with diabetic polyneuropathy: Secondary | ICD-10-CM

## 2018-01-08 MED FILL — BUPROPION HCL XL 300 MG TAB: 300 | 30 days supply | Qty: 30 | Fill #4

## 2018-01-16 ENCOUNTER — Ambulatory Visit (INDEPENDENT_AMBULATORY_CARE_PROVIDER_SITE_OTHER): Payer: Self-pay | Admitting: Physician Assistant

## 2018-01-16 ENCOUNTER — Encounter (INDEPENDENT_AMBULATORY_CARE_PROVIDER_SITE_OTHER): Payer: Self-pay | Admitting: Physician Assistant

## 2018-01-16 VITALS — BP 119/75 | HR 88 | Temp 97.8°F | Ht 64.0 in | Wt 189.4 lb

## 2018-01-16 DIAGNOSIS — Z1211 Encounter for screening for malignant neoplasm of colon: Secondary | ICD-10-CM

## 2018-01-16 DIAGNOSIS — H269 Unspecified cataract: Secondary | ICD-10-CM

## 2018-01-16 DIAGNOSIS — R208 Other disturbances of skin sensation: Secondary | ICD-10-CM

## 2018-01-16 DIAGNOSIS — M25512 Pain in left shoulder: Secondary | ICD-10-CM

## 2018-01-16 MED ORDER — MELOXICAM 7.5 MG PO TABS: 7.5000 mg | ORAL_TABLET | Freq: Every day | ORAL | 0 refills | Status: DC

## 2018-01-16 NOTE — Progress Notes (Signed)
Subjective:  Patient ID: Ann Pace, female    DOB: 1955/03/29  Age: 62 y.o. MRN: [id number]  CC: referral  HPI Ann Pace a 62 y.o.femalewith a medical history of DM2, peripheral neuropathy,bilateral knee arthroscopyand gout presents to request referral to neurology for previously diagnosed loss of protective sensation of feet likely 2/2 Diabetic neuropathy. Also requests gastroenterology referral for colon cancer screening.     Complains of left shoulder pain in the area of the coracoid process attributed to working in [place] approximately one month ago. Had been lifting and loading heavy boxes for 7 days. Pt lifted boxes that weighed up to 55-60 lbs over her head. Had no injury/fall, popping, or loss of aROM. Pain 10/10 at worst. Currently 5/10. Hurts to flex and abduct shoulder.       Outpatient Medications Prior to Visit  Medication Sig Dispense Refill  . allopurinol (ZYLOPRIM) 300 MG tablet Take 1 tablet (300 mg total) by mouth daily. 30 tablet 11  . Blood Glucose Monitoring Suppl (ACCU-CHEK AVIVA PLUS) w/Device KIT 1 each by Does not apply route daily. 1 kit 0  . buPROPion (WELLBUTRIN XL) 300 MG 24 hr tablet Take 1 tablet (300 mg total) by mouth daily. 30 tablet 5  . celecoxib (CELEBREX) 200 MG capsule Take 1 capsule (200 mg total) by mouth 2 (two) times daily. 60 capsule 2  . dapagliflozin propanediol (FARXIGA) 10 MG TABS tablet Take 10 mg by mouth daily. 30 tablet 11  . DULoxetine (CYMBALTA) 60 MG capsule Take 1 capsule (60 mg total) by mouth daily. 90 capsule 3  . gabapentin (NEURONTIN) 300 MG capsule Take 1 capsule (300 mg total) by mouth at bedtime. 30 capsule 11  . glucose blood (ACCU-CHEK AVIVA) test strip Use TID. 100 each 12  . Insulin Glargine (LANTUS SOLOSTAR) 100 UNIT/ML Solostar Pen Inject 50 units at bedtime and 50 units 12 hours after bedtime dose. 10 pen 11  . insulin lispro (HUMALOG KWIKPEN) 100 UNIT/ML KiwkPen If sugar 150-200 take 2 units If sugar 201-251  take 4 units If sugar 251-300 take 6 units If sugar 301-350 take 8 units If sugar 351-400 take 10 units 15 mL 11  . Insulin Pen Needle (PEN NEEDLES) 30G X 8 MM MISC Inject 1 application into the skin 5 (five) times daily. 150 each 11  . Lancets (ACCU-CHEK SOFT TOUCH) lancets Use TID. 100 each 12  . lisinopril (PRINIVIL,ZESTRIL) 10 MG tablet Take 1 tablet (10 mg total) by mouth daily. 90 tablet 3  . naproxen (NAPROSYN) 500 MG tablet Take 1 tablet (500 mg total) by mouth 2 (two) times daily with a meal. 40 tablet 0  . omega-3 acid ethyl esters (LOVAZA) 1 g capsule Take 2 capsules (2 g total) by mouth 2 (two) times daily. 360 capsule 1  . rosuvastatin (CRESTOR) 20 MG tablet Take 1 tablet (20 mg total) by mouth daily. 90 tablet 1   No facility-administered medications prior to visit.      ROS Review of Systems  Constitutional: Negative for chills, fever and malaise/fatigue.  Eyes: Negative for blurred vision.  Respiratory: Negative for shortness of breath.   Cardiovascular: Negative for chest pain and palpitations.  Gastrointestinal: Negative for abdominal pain and nausea.  Genitourinary: Negative for dysuria and hematuria.  Musculoskeletal: Negative for joint pain and myalgias.  Skin: Negative for rash.  Neurological: Negative for tingling and headaches.  Psychiatric/Behavioral: Negative for depression. The patient is not nervous/anxious.     Objective:  BP 119/75 (BP Location:  Left Arm, Patient Position: Sitting, Cuff Size: Large)   Pulse 88   Temp 97.8 F (36.6 C) (Oral)   Ht 5' 4" (1.626 m)   Wt 189 lb 6.4 oz (85.9 kg)   SpO2 96%   BMI 32.51 kg/m   BP/Weight 01/16/2018 06/24/7060 05/29/6281  Systolic BP 151 761 607  Diastolic BP 75 76 70  Wt. (Lbs) 189.4 181.8 183  BMI 32.51 31.21 31.41      Physical Exam  Constitutional: She is oriented to person, place, and time.  Well developed, well nourished, NAD, polite  HENT:  Head: Normocephalic and atraumatic.  Eyes: No  scleral icterus.  Neck: Normal range of motion. Neck supple. No thyromegaly present.  Cardiovascular: Normal rate, regular rhythm and normal heart sounds.  Pulmonary/Chest: Effort normal and breath sounds normal.  Musculoskeletal: She exhibits no edema or deformity.  Left shoulder with TTP over the coracoid process and the anterior deltoid. Pain elicited with cross body adduction and internal rotation resistance.  Neurological: She is alert and oriented to person, place, and time.  Skin: Skin is warm and dry. No rash noted. No erythema. No pallor.  Psychiatric: She has a normal mood and affect. Her behavior is normal. Thought content normal.  Vitals reviewed.    Assessment & Plan:    1. Loss of protective sensation of skin of foot - Ambulatory referral to Neurology  2. Screening for colon cancer - Ambulatory referral to Gastroenterology  3. Acute pain of left shoulder - DG Shoulder Left; Future - Begin meloxicam (MOBIC) 7.5 MG tablet; Take 1 tablet (7.5 mg total) by mouth daily.  Dispense: 30 tablet; Refill: 0  4. Cataract of both eyes, unspecified cataract type - Ambulatory referral to Ophthalmology   Meds ordered this encounter  Medications  . meloxicam (MOBIC) 7.5 MG tablet    Sig: Take 1 tablet (7.5 mg total) by mouth daily.    Dispense:  30 tablet    Refill:  0    Order Specific Question:   Supervising Provider    Answer:   Charlott Rakes [[id number]]    Follow-up: Return in about 2 weeks (around 01/30/2018) for left shoulder pain.   Clent Demark PA

## 2018-01-16 NOTE — Patient Instructions (Signed)
Shoulder Pain Many things can cause shoulder pain, including:  An injury.  Moving the arm in the same way again and again (overuse).  Joint pain (arthritis).  Follow these instructions at home: Take these actions to help with your pain:  Squeeze a soft ball or a foam pad as much as you can. This helps to prevent swelling. It also makes the arm stronger.  Take over-the-counter and prescription medicines only as told by your doctor.  If told, put ice on the area: ? Put ice in a plastic bag. ? Place a towel between your skin and the bag. ? Leave the ice on for 20 minutes, 2-3 times per day. Stop putting on ice if it does not help with the pain.  If you were given a shoulder sling or immobilizer: ? Wear it as told. ? Remove it to shower or bathe. ? Move your arm as little as possible. ? Keep your hand moving. This helps prevent swelling.  Contact a doctor if:  Your pain gets worse.  Medicine does not help your pain.  You have new pain in your arm, hand, or fingers. Get help right away if:  Your arm, hand, or fingers: ? Tingle. ? Are numb. ? Are swollen. ? Are painful. ? Turn white or blue. This information is not intended to replace advice given to you by your health care provider. Make sure you discuss any questions you have with your health care provider. Document Released: 08/28/2007 Document Revised: 11/05/2015 Document Reviewed: 07/04/2014 Elsevier Interactive Patient Education  2018 Elsevier Inc.  

## 2018-01-23 ENCOUNTER — Ambulatory Visit (HOSPITAL_COMMUNITY)
Admission: RE | Admit: 2018-01-23 | Discharge: 2018-01-23 | Disposition: A | Discharge status: Home / Self Care | Payer: PRIVATE HEALTH INSURANCE | Source: Ambulatory Visit | Attending: Physician Assistant | Admitting: Physician Assistant

## 2018-01-23 DIAGNOSIS — M19012 Primary osteoarthritis, left shoulder: Secondary | ICD-10-CM | POA: Insufficient documentation

## 2018-01-23 DIAGNOSIS — M25512 Pain in left shoulder: Secondary | ICD-10-CM

## 2018-01-27 ENCOUNTER — Telehealth (INDEPENDENT_AMBULATORY_CARE_PROVIDER_SITE_OTHER): Payer: Self-pay

## 2018-01-27 NOTE — Telephone Encounter (Signed)
Patient was at work. Patients sister Violet was informed that patient mild AC joint degenerative change is stable. No acute findings, no fracture or dislocation. PCP believes she may have strained a ligament which will take time to heal. She will inform patient. Maryjean Morn, CMA

## 2018-01-27 NOTE — Telephone Encounter (Signed)
-----   Message from Loletta Specter, PA-C sent at 01/23/2018  1:41 PM EDT ----- Mild AC joint degenerative change is stable. No acute findings. No fracture or dislocation. I believe she may have strained a ligament which will take time to heal.

## 2018-02-06 ENCOUNTER — Encounter (INDEPENDENT_AMBULATORY_CARE_PROVIDER_SITE_OTHER): Payer: Self-pay | Admitting: Physician Assistant

## 2018-02-06 ENCOUNTER — Other Ambulatory Visit: Payer: Self-pay

## 2018-02-06 ENCOUNTER — Ambulatory Visit (INDEPENDENT_AMBULATORY_CARE_PROVIDER_SITE_OTHER): Payer: Self-pay | Admitting: Physician Assistant

## 2018-02-06 VITALS — BP 123/75 | HR 97 | Temp 97.9°F | Ht 64.0 in | Wt 188.8 lb

## 2018-02-06 DIAGNOSIS — M25512 Pain in left shoulder: Secondary | ICD-10-CM

## 2018-02-06 DIAGNOSIS — G8929 Other chronic pain: Secondary | ICD-10-CM

## 2018-02-06 DIAGNOSIS — Z23 Encounter for immunization: Secondary | ICD-10-CM

## 2018-02-06 DIAGNOSIS — R208 Other disturbances of skin sensation: Secondary | ICD-10-CM

## 2018-02-06 DIAGNOSIS — R195 Other fecal abnormalities: Secondary | ICD-10-CM

## 2018-02-06 MED ORDER — ACETAMINOPHEN 500 MG PO TABS: 1000.0000 mg | ORAL_TABLET | Freq: Three times a day (TID) | ORAL | 0 refills | Status: DC | PRN

## 2018-02-06 MED ORDER — MELOXICAM 15 MG PO TABS: 15.0000 mg | ORAL_TABLET | Freq: Every day | ORAL | 0 refills | Status: DC

## 2018-02-06 NOTE — Progress Notes (Signed)
Subjective:  Patient ID: Ann Pace, female    DOB: 27-Jun-1955  Age: 62 y.o. MRN: [id number]  CC: f/u left shoulder pain  HPI Ann Pace a 62 y.o.femalewith a medical history of DM2, peripheral neuropathy,bilateral knee arthroscopyand gout presents to f/u on left shoulder pain in the area of the coracoid process attributed to working in [place] approximately approximately seven weeks ago. Had been lifting and loading heavy boxes for 7 days. Pt lifted boxes that weighed up to 55-60 lbs over her head. Had no injury/fall, popping, or loss of aROM. Pain 10/10 at worst. Currently 5/10. Hurts to flex and abduct shoulder. She was advised to take Meloxicam 7.5 mg which was not helpful. Pt feels referred pain to the biceps. Left shoulder Xray revealed "mild AC joint degenerative change is stable. No acute findings. No fracture or dislocation".     Pt has received 100% CAFA and is ready to be referred to Neurology for loss of protective sensation of the feet, Gastroenterology for positive FIT, and to orthopedics for left shoulder pain.      Outpatient Medications Prior to Visit  Medication Sig Dispense Refill  . allopurinol (ZYLOPRIM) 300 MG tablet Take 1 tablet (300 mg total) by mouth daily. 30 tablet 11  . Blood Glucose Monitoring Suppl (ACCU-CHEK AVIVA PLUS) w/Device KIT 1 each by Does not apply route daily. 1 kit 0  . buPROPion (WELLBUTRIN XL) 300 MG 24 hr tablet Take 1 tablet (300 mg total) by mouth daily. 30 tablet 5  . celecoxib (CELEBREX) 200 MG capsule Take 1 capsule (200 mg total) by mouth 2 (two) times daily. 60 capsule 2  . dapagliflozin propanediol (FARXIGA) 10 MG TABS tablet Take 10 mg by mouth daily. 30 tablet 11  . DULoxetine (CYMBALTA) 60 MG capsule Take 1 capsule (60 mg total) by mouth daily. 90 capsule 3  . gabapentin (NEURONTIN) 300 MG capsule Take 1 capsule (300 mg total) by mouth at bedtime. 30 capsule 11  . glucose blood (ACCU-CHEK AVIVA) test strip Use TID. 100 each 12   . Insulin Glargine (LANTUS SOLOSTAR) 100 UNIT/ML Solostar Pen Inject 50 units at bedtime and 50 units 12 hours after bedtime dose. 10 pen 11  . insulin lispro (HUMALOG KWIKPEN) 100 UNIT/ML KiwkPen If sugar 150-200 take 2 units If sugar 201-251 take 4 units If sugar 251-300 take 6 units If sugar 301-350 take 8 units If sugar 351-400 take 10 units 15 mL 11  . Insulin Pen Needle (PEN NEEDLES) 30G X 8 MM MISC Inject 1 application into the skin 5 (five) times daily. 150 each 11  . Lancets (ACCU-CHEK SOFT TOUCH) lancets Use TID. 100 each 12  . lisinopril (PRINIVIL,ZESTRIL) 10 MG tablet Take 1 tablet (10 mg total) by mouth daily. 90 tablet 3  . meloxicam (MOBIC) 7.5 MG tablet Take 1 tablet (7.5 mg total) by mouth daily. 30 tablet 0  . naproxen (NAPROSYN) 500 MG tablet Take 1 tablet (500 mg total) by mouth 2 (two) times daily with a meal. 40 tablet 0  . omega-3 acid ethyl esters (LOVAZA) 1 g capsule Take 2 capsules (2 g total) by mouth 2 (two) times daily. 360 capsule 1  . rosuvastatin (CRESTOR) 20 MG tablet Take 1 tablet (20 mg total) by mouth daily. 90 tablet 1   No facility-administered medications prior to visit.      ROS Review of Systems  Constitutional: Negative for chills, fever and malaise/fatigue.  Eyes: Negative for blurred vision.  Respiratory: Negative for shortness  of breath.   Cardiovascular: Negative for chest pain and palpitations.  Gastrointestinal: Negative for abdominal pain and nausea.  Genitourinary: Negative for dysuria and hematuria.  Musculoskeletal: Positive for joint pain. Negative for myalgias.  Skin: Negative for rash.  Neurological: Negative for tingling and headaches.  Psychiatric/Behavioral: Negative for depression. The patient is not nervous/anxious.     Objective:  BP 123/75 (BP Location: Left Arm, Patient Position: Sitting, Cuff Size: Large)   Pulse 97   Temp 97.9 F (36.6 C) (Oral)   Ht 5' 4"  (1.626 m)   Wt 188 lb 12.8 oz (85.6 kg)   SpO2 96%    BMI 32.41 kg/m   BP/Weight 02/06/2018 69/24/9324 04/02/9142  Systolic BP 458 483 507  Diastolic BP 75 75 76  Wt. (Lbs) 188.8 189.4 181.8  BMI 32.41 32.51 31.21      Physical Exam  Constitutional: She is oriented to person, place, and time.  Well developed, well nourished, NAD, polite  HENT:  Head: Normocephalic and atraumatic.  Eyes: No scleral icterus.  Neck: Normal range of motion. Neck supple. No thyromegaly present.  Pulmonary/Chest: Effort normal.  Musculoskeletal: She exhibits no edema.  Full aROM of left shoulder.  Neurological: She is alert and oriented to person, place, and time.  Skin: Skin is warm and dry. No rash noted. No erythema. No pallor.  Psychiatric: She has a normal mood and affect. Her behavior is normal. Thought content normal.  Vitals reviewed.    Assessment & Plan:    1. Chronic left shoulder pain - AMB referral to orthopedics  2. Loss of protective sensation of skin of foot - Pt has appointment on 03/12/18  3. Positive fecal immunochemical test - Pt given number to [facility] so she may schedule her appointment. Referral notes say they have left VM to call back to schedule her appointment.     Follow-up: Return if symptoms worsen or fail to improve.   Clent Demark PA

## 2018-02-06 NOTE — Patient Instructions (Signed)
Please contact [facility] [phone] to schedule your colonoscopy.    Shoulder Pain Many things can cause shoulder pain, including:  An injury.  Moving the arm in the same way again and again (overuse).  Joint pain (arthritis).  Follow these instructions at home: Take these actions to help with your pain:  Squeeze a soft ball or a foam pad as much as you can. This helps to prevent swelling. It also makes the arm stronger.  Take over-the-counter and prescription medicines only as told by your doctor.  If told, put ice on the area: ? Put ice in a plastic bag. ? Place a towel between your skin and the bag. ? Leave the ice on for 20 minutes, 2-3 times per day. Stop putting on ice if it does not help with the pain.  If you were given a shoulder sling or immobilizer: ? Wear it as told. ? Remove it to shower or bathe. ? Move your arm as little as possible. ? Keep your hand moving. This helps prevent swelling.  Contact a doctor if:  Your pain gets worse.  Medicine does not help your pain.  You have new pain in your arm, hand, or fingers. Get help right away if:  Your arm, hand, or fingers: ? Tingle. ? Are numb. ? Are swollen. ? Are painful. ? Turn white or blue. This information is not intended to replace advice given to you by your health care provider. Make sure you discuss any questions you have with your health care provider. Document Released: 08/28/2007 Document Revised: 11/05/2015 Document Reviewed: 07/04/2014 Elsevier Interactive Patient Education  [employer]2018 [employer]

## 2018-02-10 ENCOUNTER — Encounter (INDEPENDENT_AMBULATORY_CARE_PROVIDER_SITE_OTHER): Payer: Self-pay | Admitting: Family Medicine

## 2018-02-10 ENCOUNTER — Ambulatory Visit (INDEPENDENT_AMBULATORY_CARE_PROVIDER_SITE_OTHER): Payer: Self-pay | Admitting: Family Medicine

## 2018-02-10 DIAGNOSIS — G8929 Other chronic pain: Secondary | ICD-10-CM

## 2018-02-10 DIAGNOSIS — M25512 Pain in left shoulder: Secondary | ICD-10-CM

## 2018-02-10 NOTE — Progress Notes (Signed)
.   Ann Pace - 62 y.o. female MRN [id number]  Date of birth: 09/24/1955    SUBJECTIVE:      Chief Complaint:/ HPI:  62 year old female presents with few months of left shoulder pain.  She believes her pain started after lifting boxes overhead at her job.  She denies any specific moment of injury.  She localizes her pain more in the anterior part of the shoulder.  Is made worse with overhead movements, laying on her side, and carrying items in her hand.  Improved with rest and naproxen 500 mg.  She denies any prior surgeries or injuries in the shoulder.  No swelling or erythema.  She does have occasional tingling hand but this predates the shoulder pain.  No significant weakness reported.  No associated skin changes   ROS:     See HPI  PERTINENT  PMH / PSH FH / / SH:  Past Medical, Surgical, Social, and Family History Reviewed & Updated in the EMR.  Pertinent findings include:  Diabetes  OBJECTIVE: There were no vitals taken for this visit.  Physical Exam:  Vital signs are reviewed.  GEN: Alert and oriented, NAD Pulm: Breathing unlabored PSY: normal mood, congruent affect  MSK: Left shoulder: No obvious deformity or asymmetry. No bruising. No swelling Tenderness anteriorly over the bicipital groove.  Mild tenderness over the [facility] Full ROM in flexion, abduction.  Slightly restricted IR/ER NV intact distally Special Tests:  - Impingement: Pain with Hawkins and Neers.  - Supraspinatous: Pain with empty can.  5/5 strength - Infraspinatous/Teres: 5/5 strength with ER - Subscapularis: negative belly press, negative bear hug. 5/5 strength with IR - Biceps tendon: Positive speeds.  Positive Yergason - Labrum: Negative Obriens.   MSK US: Limited ultrasound of the left shoulder, fluid visualized surrounding the proximal biceps tendon   Right shoulder: No deformity or swelling No tenderness over the proximal biceps tendon.  Slight tenderness over the [facility] Full range of  motion N/V intact 5/5 strength with rotator cuff testing   ASSESSMENT & PLAN:  1.  Left shoulder pain-patient's pain is most consistent with biceps tenosynovitis.  There is some elements of her exam consistent with impingement as well however there is no concern at this time for rotator cuff tear. - Steroid injection in the proximal biceps performed today - Physical therapy -Follow-up as needed   Procedure performed: Proximal biceps tendon injection, US guided  Consent obtained and verified. Time-out conducted. Noted no overlying erythema, induration, or other signs of local infection. The  left proximal biceps tendon was identified with ultrasound.  The overlying skin was prepped in a sterile fashion. Topical analgesic spray: Ethyl chloride. Needle: 25-gauge, 1.5 inch Using an out of plane approach the needle was advanced into the tendon sheath and medications were injected.  Completed without difficulty. Meds: Depo-Medrol 40 mg, lidocaine 3 cc

## 2018-02-10 NOTE — Progress Notes (Signed)
I saw and examined the patient with Dr. Jamse MeadBarron and agree with assessment and plan as outlined.  Inject left long head biceps today, start PT.  MRI if fails to improve, vs. Possible glenohumeral injection.

## 2018-02-17 ENCOUNTER — Encounter (HOSPITAL_COMMUNITY): Payer: Self-pay

## 2018-02-17 ENCOUNTER — Other Ambulatory Visit: Payer: Self-pay

## 2018-02-17 ENCOUNTER — Emergency Department (HOSPITAL_COMMUNITY)
Admission: EM | Admit: 2018-02-17 | Discharge: 2018-02-17 | Disposition: A | Discharge status: Home / Self Care | Payer: PRIVATE HEALTH INSURANCE | Attending: Emergency Medicine | Admitting: Emergency Medicine

## 2018-02-17 DIAGNOSIS — R739 Hyperglycemia, unspecified: Secondary | ICD-10-CM

## 2018-02-17 DIAGNOSIS — E1165 Type 2 diabetes mellitus with hyperglycemia: Secondary | ICD-10-CM | POA: Insufficient documentation

## 2018-02-17 DIAGNOSIS — Z794 Long term (current) use of insulin: Secondary | ICD-10-CM | POA: Insufficient documentation

## 2018-02-17 DIAGNOSIS — T380X5A Adverse effect of glucocorticoids and synthetic analogues, initial encounter: Secondary | ICD-10-CM

## 2018-02-17 LAB — CBC
HEMATOCRIT: 47.6 % — AB (ref 36.0–46.0)
HEMOGLOBIN: 14.9 g/dL (ref 12.0–15.0)
MCH: 28 pg (ref 26.0–34.0)
MCHC: 31.3 g/dL (ref 30.0–36.0)
MCV: 89.3 fL (ref 80.0–100.0)
Platelets: 304 10*3/uL (ref 150–400)
RBC: 5.33 MIL/uL — AB (ref 3.87–5.11)
RDW: 13.4 % (ref 11.5–15.5)
WBC: 12.8 10*3/uL — ABNORMAL HIGH (ref 4.0–10.5)
nRBC: 0 % (ref 0.0–0.2)

## 2018-02-17 LAB — URINALYSIS, ROUTINE W REFLEX MICROSCOPIC
BACTERIA UA: NONE SEEN
Bilirubin Urine: NEGATIVE
HGB URINE DIPSTICK: NEGATIVE
Ketones, ur: NEGATIVE mg/dL
LEUKOCYTES UA: NEGATIVE
Nitrite: NEGATIVE
PROTEIN: NEGATIVE mg/dL
Specific Gravity, Urine: 1.026 (ref 1.005–1.030)
pH: 5 (ref 5.0–8.0)

## 2018-02-17 LAB — BASIC METABOLIC PANEL
Anion gap: 9 (ref 5–15)
BUN: 23 mg/dL — AB (ref 6–20)
CHLORIDE: 97 mmol/L — AB (ref 98–111)
CO2: 24 mmol/L (ref 22–32)
Calcium: 10.1 mg/dL (ref 8.9–10.3)
Creatinine, Ser: 1.32 mg/dL — ABNORMAL HIGH (ref 0.44–1.00)
GFR calc Af Amer: 51 mL/min — ABNORMAL LOW (ref >60)
GFR calc non Af Amer: 44 mL/min — ABNORMAL LOW (ref >60)
Glucose, Bld: 699 mg/dL (ref 70–99)
POTASSIUM: 4.3 mmol/L (ref 3.5–5.1)
Sodium: 130 mmol/L — ABNORMAL LOW (ref 135–145)

## 2018-02-17 LAB — CBG MONITORING, ED
Glucose-Capillary: >600 mg/dL (ref 70–99)
Glucose-Capillary: 487 mg/dL — ABNORMAL HIGH (ref 70–99)
Glucose-Capillary: 396 mg/dL — ABNORMAL HIGH (ref 70–99)

## 2018-02-17 MED ORDER — DEXTROSE 50 % IV SOLN: 25.0000 mL | INTRAVENOUS | Status: DC | PRN

## 2018-02-17 MED ORDER — SODIUM CHLORIDE 0.9 % IV SOLN
INTRAVENOUS | Status: AC
  Administered 2018-02-17: 13:00:00 via INTRAVENOUS

## 2018-02-17 MED ORDER — INSULIN REGULAR BOLUS VIA INFUSION
0.0000 [IU] | Freq: Three times a day (TID) | INTRAVENOUS | Status: DC
  Filled 2018-02-17: qty 10

## 2018-02-17 MED ORDER — INSULIN REGULAR(HUMAN) IN NACL 100-0.9 UT/100ML-% IV SOLN
INTRAVENOUS | Status: DC
  Administered 2018-02-17: 3.4 [IU]/h via INTRAVENOUS
  Filled 2018-02-17: qty 100

## 2018-02-17 MED ORDER — DEXTROSE-NACL 5-0.45 % IV SOLN: INTRAVENOUS | Status: DC

## 2018-02-17 MED ORDER — SODIUM CHLORIDE 0.9 % IV BOLUS
1000.0000 mL | Freq: Once | INTRAVENOUS | Status: AC
  Administered 2018-02-17: 1000 mL via INTRAVENOUS

## 2018-02-17 NOTE — Discharge Instructions (Signed)
Contact a health care provider if: °Your blood glucose is at or above 240 mg/dL (13.3 mmol/L) for 2 days in a row. °You have problems keeping your blood glucose in your target range. °You have frequent episodes of hyperglycemia. °Get help right away if: °You have difficulty breathing. °You have a change in how you think, feel, or act (mental status). °You have nausea or vomiting that does not go away. °

## 2018-02-17 NOTE — ED Provider Notes (Signed)
[facility] EMERGENCY DEPARTMENT Provider Note   CSN: [id number] Arrival date & time: 02/17/18  1142     History   Chief Complaint Chief Complaint  Patient presents with  . Hyperglycemia    HPI Ann Pace is a 62 y.o. female.  61 year old female with history of diabetes presents with complaint of elevated blood sugar.  Patient was seen by orthopedics on February 10, 2018 for left shoulder pain with steroid joint injection.  Patient states since that time her blood sugar has been running higher than usual and today her glucometer read high.  Patient has not had any water to drink, she is using her sliding scale insulin without improvement in her blood sugar at home.  She reports feeling very thirsty, had urinary frequency yesterday, reports feeling fatigued today.  She denies fevers, chills, nausea, vomiting or changes in bowel habits.  Patient has been seen in the emergency room previously for hyperglycemia, she denies diabetic ketoacidosis, is generally given fluids and insulin and able to go home after several hours.  No other complaints or concerns today.     Past Medical History:  Diagnosis Date  . Depression   . Diabetes mellitus without complication [facility])     Patient Active Problem List   Diagnosis Date Noted  . Type 2 diabetes mellitus ([place]) 05/15/2017    Past Surgical History:  Procedure Laterality Date  . ABDOMINAL HYSTERECTOMY    . CARPAL TUNNEL RELEASE Right      OB History    Gravida  2   Para      Term      Preterm      AB      Living  2     SAB      TAB      Ectopic      Multiple      Live Births  2            Home Medications    Prior to Admission medications   Medication Sig Start Date End Date Taking? Authorizing Provider  acetaminophen (TYLENOL) 500 MG tablet Take 2 tablets (1,000 mg total) by mouth every 8 (eight) hours as needed. Patient taking differently: Take 1,000 mg by mouth every 8 (eight)  hours as needed for mild pain or headache.  02/06/18  Yes Clent Demark, PA-C  allopurinol (ZYLOPRIM) 300 MG tablet Take 1 tablet (300 mg total) by mouth daily. 05/15/17  Yes Clent Demark, PA-C  buPROPion (WELLBUTRIN XL) 300 MG 24 hr tablet Take 1 tablet (300 mg total) by mouth daily. 05/15/17  Yes Clent Demark, PA-C  celecoxib (CELEBREX) 200 MG capsule Take 1 capsule (200 mg total) by mouth 2 (two) times daily. Patient taking differently: Take 200 mg by mouth daily.  06/12/17  Yes Clent Demark, PA-C  dapagliflozin propanediol (FARXIGA) 10 MG TABS tablet Take 10 mg by mouth daily. 05/15/17  Yes Clent Demark, PA-C  DULoxetine (CYMBALTA) 60 MG capsule Take 1 capsule (60 mg total) by mouth daily. 05/15/17  Yes Clent Demark, PA-C  gabapentin (NEURONTIN) 300 MG capsule Take 1 capsule (300 mg total) by mouth at bedtime. 10/07/17  Yes Clent Demark, PA-C  Insulin Glargine (LANTUS SOLOSTAR) 100 UNIT/ML Solostar Pen Inject 50 units at bedtime and 50 units 12 hours after bedtime dose. Patient taking differently: Inject 50 Units into the skin 2 (two) times daily.  10/07/17  Yes Clent Demark, PA-C  insulin lispro (HUMALOG  KWIKPEN) 100 UNIT/ML KiwkPen If sugar 150-200 take 2 units If sugar 201-251 take 4 units If sugar 251-300 take 6 units If sugar 301-350 take 8 units If sugar 351-400 take 10 units Patient taking differently: Inject 2-10 Units into the skin See admin instructions. Per Sliding Scale three times daily. If sugar 150-200 take 2 units If sugar 201-251 take 4 units If sugar 251-300 take 6 units If sugar 301-350 take 8 units If sugar 351-400 take 10 units 06/12/17  Yes Clent Demark, PA-C  lisinopril (PRINIVIL,ZESTRIL) 10 MG tablet Take 1 tablet (10 mg total) by mouth daily. 05/15/17  Yes Clent Demark, PA-C  naproxen (NAPROSYN) 500 MG tablet Take 1 tablet (500 mg total) by mouth 2 (two) times daily with a meal. Patient taking differently: Take 500  mg by mouth 2 (two) times daily as needed for moderate pain.  07/15/17  Yes Clent Demark, PA-C  omega-3 acid ethyl esters (LOVAZA) 1 g capsule Take 2 capsules (2 g total) by mouth 2 (two) times daily. 11/27/17  Yes Clent Demark, PA-C  rosuvastatin (CRESTOR) 20 MG tablet Take 1 tablet (20 mg total) by mouth daily. 11/27/17  Yes Clent Demark, PA-C  Blood Glucose Monitoring Suppl (ACCU-CHEK AVIVA PLUS) w/Device KIT 1 each by Does not apply route daily. 10/07/17   Clent Demark, PA-C  glucose blood (ACCU-CHEK AVIVA) test strip Use TID. 10/07/17   Clent Demark, PA-C  Insulin Pen Needle (PEN NEEDLES) 30G X 8 MM MISC Inject 1 application into the skin 5 (five) times daily. 06/12/17   Clent Demark, PA-C  Lancets [facility]) lancets Use TID. 10/07/17   Clent Demark, PA-C  meloxicam [facility]) 15 MG tablet Take 1 tablet (15 mg total) by mouth daily. Patient not taking: Reported on 02/10/2018 02/06/18   Clent Demark, PA-C    Family History Family History  Problem Relation Age of Onset  . Diabetes Mother   . Hypertension Mother   . Hypertension Father   . Diabetes Sister   . Hypertension Sister   . Hypertension Sister   . Breast cancer Neg Hx     Social History Social History   Tobacco Use  . Smoking status: Never Smoker  . Smokeless tobacco: Never Used  Substance Use Topics  . Alcohol use: No  . Drug use: No     Allergies   Penicillins   Review of Systems Review of Systems  Constitutional: Positive for fatigue. Negative for chills and fever.  Respiratory: Negative for shortness of breath.   Cardiovascular: Negative for chest pain.  Gastrointestinal: Negative for abdominal pain, constipation, diarrhea, nausea and vomiting.  Endocrine: Positive for polydipsia. Negative for polyuria.  Genitourinary: Negative for dysuria, frequency and urgency.  Skin: Negative for rash and wound.  Allergic/Immunologic: Positive for immunocompromised  state.  Neurological: Negative for dizziness and weakness.  Hematological: Does not bruise/bleed easily.  Psychiatric/Behavioral: Negative for confusion.  All other systems reviewed and are negative.    Physical Exam Updated Vital Signs BP 111/77   Pulse (!) 107   Temp 98.7 F (37.1 C) (Oral)   Resp 16   SpO2 96%   Physical Exam  Constitutional: She is oriented to person, place, and time. She appears well-developed and well-nourished. No distress.  HENT:  Head: Normocephalic and atraumatic.  Mouth/Throat: Oropharynx is clear and moist.  Neck: Neck supple.  Cardiovascular: Normal rate, regular rhythm, normal heart sounds and intact distal pulses.  No murmur  heard. Pulmonary/Chest: Effort normal and breath sounds normal. No respiratory distress.  Abdominal: Soft. There is no tenderness.  Neurological: She is alert and oriented to person, place, and time.  Skin: Skin is warm and dry. She is not diaphoretic.  Psychiatric: She has a normal mood and affect. Her behavior is normal.  Nursing note and vitals reviewed.    ED Treatments / Results  Labs (all labs ordered are listed, but only abnormal results are displayed) Labs Reviewed  BASIC METABOLIC PANEL - Abnormal; Notable for the following components:      Result Value   Sodium 130 (*)    Chloride 97 (*)    Glucose, Bld 699 (*)    BUN 23 (*)    Creatinine, Ser 1.32 (*)    GFR calc non Af Amer 44 (*)    GFR calc Af Amer 51 (*)    All other components within normal limits  CBC - Abnormal; Notable for the following components:   WBC 12.8 (*)    RBC 5.33 (*)    HCT 47.6 (*)    All other components within normal limits  URINALYSIS, ROUTINE W REFLEX MICROSCOPIC - Abnormal; Notable for the following components:   Color, Urine STRAW (*)    Glucose, UA >=500 (*)    All other components within normal limits  CBG MONITORING, ED - Abnormal; Notable for the following components:   Glucose-Capillary >600 (*)    All other  components within normal limits  CBG MONITORING, ED - Abnormal; Notable for the following components:   Glucose-Capillary 487 (*)    All other components within normal limits    EKG EKG Interpretation  Date/Time:  Tuesday February 17 2018 12:52:59 EST Ventricular Rate:  91 PR Interval:    QRS Duration: 98 QT Interval:  364 QTC Calculation: 448 R Axis:   4 Text Interpretation:  Sinus rhythm Baseline wander in lead(s) V4 V5 similar to prior 2/19 Confirmed by Aletta Edouard [phone]) on 02/17/2018 2:34:19 PM   Radiology No results found.  Procedures Procedures (including critical care time)  Medications Ordered in ED Medications  dextrose 5 %-0.45 % sodium chloride infusion (has no administration in time range)  insulin regular bolus via infusion 0-10 Units (has no administration in time range)  insulin regular, human (MYXREDLIN) 100 units/ 100 mL infusion (has no administration in time range)  dextrose 50 % solution 25 mL (has no administration in time range)  0.9 %  sodium chloride infusion ( Intravenous Stopped 02/17/18 1521)  sodium chloride 0.9 % bolus 1,000 mL (1,000 mLs Intravenous New Bag/Given 02/17/18 1522)     Initial Impression / Assessment and Plan / ED Course  I have reviewed the triage vital signs and the nursing notes.  Pertinent labs & imaging results that were available during my care of the patient were reviewed by me and considered in my medical decision making (see chart for details).  Clinical Course as of Feb 17 1550  Tue Feb 17, 3238  [id number] 62 year old female with history of diabetes hyperglycemia here with elevated blood sugars after getting a steroid injection.  She is otherwise nontoxic-appearing this is happened to her before.  She is getting some IV fluids and insulin and will likely be able to be discharged once her sugars are more baseline.   [MB]  2671 Care signed out to [facility], PA-C, pending better glucose control.    [LM]      Clinical Course User Index [LM] Tacy Learn,  PA-C [MB] Hayden Rasmussen, MD   Final Clinical Impressions(s) / ED Diagnoses   Final diagnoses:  Hyperglycemia    ED Discharge Orders    None       Tacy Learn, PA-C 02/17/18 1551    Hayden Rasmussen, MD 02/17/18 1954

## 2018-02-17 NOTE — ED Provider Notes (Signed)
+  hyperglycemia  NO DKA Decreased from 600 to 400 with 1 litre Awaiting end of fluids to reevaluate sugar   Patient blood sugars have decreased markedly with fluids.  Patient is not confused and does not appear to have DKA. Her sugars are elevated from recent steroid injection. She is a sliding scale insulin and is encouraged to correct with her sliding scale but I have advised patient call tomorrow to speak with her PCP. Gust return precautions the patient appears appropriate for discharge at this time   Arthor CaptainHarris, Danyle Boening, Cordelia Poche-C 02/17/18 2310    Cathren LaineSteinl, Kevin, MD 02/18/18 1524

## 2018-02-17 NOTE — ED Triage Notes (Signed)
Pt states she had cortisone injection last Tuesday. She reports her cbg has been running high now it will not register. Reports some abdominal discomfort yesterday.

## 2018-02-18 MED FILL — $LANTUS SOLOSTAR 100 UNITS/: 100 | 30 days supply | Qty: 30 | Fill #2

## 2018-02-26 ENCOUNTER — Encounter: Payer: Self-pay | Admitting: Physician Assistant

## 2018-03-02 ENCOUNTER — Encounter (HOSPITAL_COMMUNITY): Payer: Self-pay

## 2018-03-02 ENCOUNTER — Encounter (HOSPITAL_BASED_OUTPATIENT_CLINIC_OR_DEPARTMENT_OTHER): Payer: Self-pay

## 2018-03-02 ENCOUNTER — Emergency Department (HOSPITAL_COMMUNITY): Payer: Self-pay

## 2018-03-02 ENCOUNTER — Emergency Department (HOSPITAL_BASED_OUTPATIENT_CLINIC_OR_DEPARTMENT_OTHER)
Admission: EM | Admit: 2018-03-02 | Discharge: 2018-03-02 | Disposition: A | Discharge status: Home / Self Care | Payer: Self-pay | Attending: Emergency Medicine | Admitting: Emergency Medicine

## 2018-03-02 ENCOUNTER — Emergency Department (HOSPITAL_COMMUNITY)
Admission: EM | Admit: 2018-03-02 | Discharge: 2018-03-02 | Disposition: A | Discharge status: Home / Self Care | Payer: Self-pay | Attending: Emergency Medicine | Admitting: Emergency Medicine

## 2018-03-02 ENCOUNTER — Other Ambulatory Visit: Payer: Self-pay

## 2018-03-02 DIAGNOSIS — R739 Hyperglycemia, unspecified: Secondary | ICD-10-CM

## 2018-03-02 DIAGNOSIS — R059 Cough, unspecified: Secondary | ICD-10-CM

## 2018-03-02 DIAGNOSIS — Z5321 Procedure and treatment not carried out due to patient leaving prior to being seen by health care provider: Secondary | ICD-10-CM | POA: Insufficient documentation

## 2018-03-02 DIAGNOSIS — R69 Illness, unspecified: Secondary | ICD-10-CM | POA: Insufficient documentation

## 2018-03-02 DIAGNOSIS — E1165 Type 2 diabetes mellitus with hyperglycemia: Secondary | ICD-10-CM | POA: Insufficient documentation

## 2018-03-02 DIAGNOSIS — R05 Cough: Secondary | ICD-10-CM | POA: Insufficient documentation

## 2018-03-02 LAB — URINALYSIS, MICROSCOPIC (REFLEX)
RBC / HPF: NONE SEEN RBC/hpf (ref 0–5)
WBC, UA: NONE SEEN WBC/hpf (ref 0–5)

## 2018-03-02 LAB — URINALYSIS, ROUTINE W REFLEX MICROSCOPIC
BILIRUBIN URINE: NEGATIVE
Bilirubin Urine: NEGATIVE
Glucose, UA: >=500 mg/dL — AB
Hgb urine dipstick: NEGATIVE
Hgb urine dipstick: NEGATIVE
Ketones, ur: NEGATIVE mg/dL
Ketones, ur: NEGATIVE mg/dL
Leukocytes, UA: NEGATIVE
Leukocytes, UA: NEGATIVE
Nitrite: NEGATIVE
Nitrite: NEGATIVE
PH: 5.5 (ref 5.0–8.0)
PROTEIN: NEGATIVE mg/dL
Protein, ur: NEGATIVE mg/dL
Specific Gravity, Urine: 1.028 (ref 1.005–1.030)
pH: 5 (ref 5.0–8.0)

## 2018-03-02 LAB — CBG MONITORING, ED
Glucose-Capillary: >600 mg/dL (ref 70–99)
Glucose-Capillary: 477 mg/dL — ABNORMAL HIGH (ref 70–99)
Glucose-Capillary: 371 mg/dL — ABNORMAL HIGH (ref 70–99)
Glucose-Capillary: 321 mg/dL — ABNORMAL HIGH (ref 70–99)

## 2018-03-02 LAB — CBC
HCT: 45.6 % (ref 36.0–46.0)
Hemoglobin: 14.2 g/dL (ref 12.0–15.0)
MCH: 27.8 pg (ref 26.0–34.0)
MCHC: 31.1 g/dL (ref 30.0–36.0)
MCV: 89.2 fL (ref 80.0–100.0)
Platelets: 293 10*3/uL (ref 150–400)
RBC: 5.11 MIL/uL (ref 3.87–5.11)
RDW: 13.1 % (ref 11.5–15.5)
WBC: 11.5 10*3/uL — ABNORMAL HIGH (ref 4.0–10.5)
nRBC: 0 % (ref 0.0–0.2)

## 2018-03-02 LAB — BASIC METABOLIC PANEL
Anion gap: 12 (ref 5–15)
BUN: 19 mg/dL (ref 6–20)
CO2: 24 mmol/L (ref 22–32)
CREATININE: 1.06 mg/dL — AB (ref 0.44–1.00)
Calcium: 10.3 mg/dL (ref 8.9–10.3)
Chloride: 99 mmol/L (ref 98–111)
GFR calc Af Amer: >60 mL/min (ref >60)
GFR, EST NON AFRICAN AMERICAN: 57 mL/min — AB (ref >60)
Glucose, Bld: 499 mg/dL — ABNORMAL HIGH (ref 70–99)
Potassium: 3.7 mmol/L (ref 3.5–5.1)
Sodium: 135 mmol/L (ref 135–145)

## 2018-03-02 MED ORDER — INSULIN ASPART 100 UNIT/ML ~~LOC~~ SOLN
2.0000 [IU] | Freq: Once | SUBCUTANEOUS | Status: AC
  Administered 2018-03-02: 2 [IU] via SUBCUTANEOUS

## 2018-03-02 NOTE — Discharge Instructions (Addendum)
You were evaluated in the Emergency Department and after careful evaluation, we did not find any emergent condition requiring admission or further testing in the hospital. ° °Please return to the Emergency Department if you experience any worsening of your condition.  We encourage you to follow up with a primary care provider.  Thank you for allowing us to be a part of your care. °

## 2018-03-02 NOTE — ED Notes (Signed)
Pt taken to XRAY

## 2018-03-02 NOTE — ED Notes (Signed)
Pts family member came to desk to see how long it would be until she was taken back.  Pt informed of the longest wait time in the [facility] at this moment and she said she would not wait.  Pt left stating "We're going to [facility]." Pt in NAD.  Walked with brisk gait.

## 2018-03-02 NOTE — ED Provider Notes (Signed)
[facility] Emergency Department Provider Note MRN:  [id number]  Arrival date & time: 03/02/18     Chief Complaint   Hyperglycemia   History of Present Illness   Ann Pace is a 62 y.o. year-old female with a history of diabetes presenting to the ED with chief complaint of hyperglycemia.  Patient explains that she had a recent steroid injection in 1 of her shoulders, and since then she has been having elevated blood sugars.  Over 400 at home today.  Patient has had a recent cough for the past day.  Denies fever, no chest pain or shortness of breath, no abdominal pain, no dysuria, no rash, no vomiting, no diarrhea.  Has been taking her diabetes medicine like normal.  More thirsty than normal recently, frequent urination.  Review of Systems  A complete 10 system review of systems was obtained and all systems are negative except as noted in the HPI and PMH.   Patient's Health History    Past Medical History:  Diagnosis Date  . Depression   . Diabetes mellitus without complication [facility])     Past Surgical History:  Procedure Laterality Date  . ABDOMINAL HYSTERECTOMY    . CARPAL TUNNEL RELEASE Right     Family History  Problem Relation Age of Onset  . Diabetes Mother   . Hypertension Mother   . Hypertension Father   . Diabetes Sister   . Hypertension Sister   . Hypertension Sister   . Breast cancer Neg Hx     Social History   Socioeconomic History  . Marital status: Married    Spouse name: Not on file  . Number of children: Not on file  . Years of education: Not on file  . Highest education level: Not on file  Occupational History  . Not on file  Social Needs  . Financial resource strain: Not on file  . Food insecurity:    Worry: Not on file    Inability: Not on file  . Transportation needs:    Medical: Not on file    Non-medical: Not on file  Tobacco Use  . Smoking status: Never Smoker  . Smokeless tobacco: Never Used  Substance and Sexual  Activity  . Alcohol use: No  . Drug use: No  . Sexual activity: Not on file  Lifestyle  . Physical activity:    Days per week: Not on file    Minutes per session: Not on file  . Stress: Not on file  Relationships  . Social connections:    Talks on phone: Not on file    Gets together: Not on file    Attends religious service: Not on file    Active member of club or organization: Not on file    Attends meetings of clubs or organizations: Not on file    Relationship status: Not on file  . Intimate partner violence:    Fear of current or ex partner: Not on file    Emotionally abused: Not on file    Physically abused: Not on file    Forced sexual activity: Not on file  Other Topics Concern  . Not on file  Social History Narrative  . Not on file     Physical Exam  Vital Signs and Nursing Notes reviewed Vitals:   03/02/18 2145 03/02/18 2215  BP: 121/66 114/61  Pulse: 95 93  Resp:    Temp:    SpO2: 98% 97%    CONSTITUTIONAL: Well-appearing, NAD NEURO:  Alert and oriented x 3, no focal deficits EYES:  eyes equal and reactive ENT/NECK:  no LAD, no JVD CARDIO: Regular rate, well-perfused, normal S1 and S2 PULM:  CTAB no wheezing or rhonchi GI/GU:  normal bowel sounds, non-distended, non-tender MSK/SPINE:  No gross deformities, no edema SKIN:  no rash, atraumatic PSYCH:  Appropriate speech and behavior  Diagnostic and Interventional Summary    Labs Reviewed  BASIC METABOLIC PANEL - Abnormal; Notable for the following components:      Result Value   Glucose, Bld 499 (*)    Creatinine, Ser 1.06 (*)    GFR calc non Af Amer 57 (*)    All other components within normal limits  CBC - Abnormal; Notable for the following components:   WBC 11.5 (*)    All other components within normal limits  URINALYSIS, ROUTINE W REFLEX MICROSCOPIC - Abnormal; Notable for the following components:   APPearance HAZY (*)    Glucose, UA >=500 (*)    Bacteria, UA RARE (*)    All other  components within normal limits  CBG MONITORING, ED - Abnormal; Notable for the following components:   Glucose-Capillary 477 (*)    All other components within normal limits  CBG MONITORING, ED - Abnormal; Notable for the following components:   Glucose-Capillary 371 (*)    All other components within normal limits  CBG MONITORING, ED - Abnormal; Notable for the following components:   Glucose-Capillary 321 (*)    All other components within normal limits    DG Chest 2 View  Final Result      Medications  insulin aspart (novoLOG) injection 2 Units (2 Units Subcutaneous Given 03/02/18 2245)     Procedures Critical Care  ED Course and Medical Decision Making  I have reviewed the triage vital signs and the nursing notes.  Pertinent labs & imaging results that were available during my care of the patient were reviewed by me and considered in my medical decision making (see below for details).  Uncomplicated hyperglycemia in the 62 year old female, thought to be related to recent steroid injection.  Patient is endorsing normal medication compliance at home, no evidence of infection here in the ED after normal work-up.  Glucose improved to low 300s, no evidence of DKA on labs.  Reassurance, will follow-up with PCP.  After the discussed management above, the patient was determined to be safe for discharge.  The patient was in agreement with this plan and all questions regarding their care were answered.  ED return precautions were discussed and the patient will return to the ED with any significant worsening of condition.   Elmer SowMichael M. Pilar PlateBero, MD [facility] [email or web address]  Final Clinical Impressions(s) / ED Diagnoses     ICD-10-CM   1. Hyperglycemia R73.9   2. Cough R05 DG Chest 2 View    DG Chest 2 View    ED Discharge Orders    None         Sabas SousBero, Tariah Transue M, MD 03/02/18 2348

## 2018-03-02 NOTE — ED Notes (Signed)
Pt discharged from ED; instructions provided; Pt encouraged to return to ED if symptoms worsen and to f/u with PCP; Pt verbalized understanding of all instructions 

## 2018-03-02 NOTE — ED Triage Notes (Signed)
Pt c.o hyperglycemia, states meter is reading high at home. Pt states she took 70 units of lantus and 16 units novolog around 3pm this afternoon. Pt denies n/v but endorsing mid thoracic back pain that started last night.

## 2018-03-02 NOTE — ED Notes (Signed)
Blood sugar 371

## 2018-03-02 NOTE — ED Triage Notes (Signed)
Pt c/o hyperglycemia-sates she was [facility] same recently after receiving steroid injection in shoulder-NAD-steady gait

## 2018-03-05 MED FILL — BUPROPION HCL XL 300 MG TAB: 300 | 30 days supply | Qty: 30 | Fill #5

## 2018-03-05 MED FILL — FARXIGA 10 MG TABLET: 10 | 30 days supply | Qty: 30 | Fill #6

## 2018-03-05 MED FILL — $CYMBALTA 60 MG CAPSULE: 60 | 90 days supply | Qty: 90 | Fill #5

## 2018-03-05 MED FILL — ROSUVASTATIN CALCIUM 10 MG: 10 | 30 days supply | Qty: 30 | Fill #1

## 2018-03-12 ENCOUNTER — Ambulatory Visit (INDEPENDENT_AMBULATORY_CARE_PROVIDER_SITE_OTHER): Payer: PRIVATE HEALTH INSURANCE | Admitting: Neurology

## 2018-03-12 ENCOUNTER — Encounter: Payer: Self-pay | Admitting: Neurology

## 2018-03-12 VITALS — BP 138/85 | HR 99 | Ht 64.0 in | Wt 191.2 lb

## 2018-03-12 DIAGNOSIS — G629 Polyneuropathy, unspecified: Secondary | ICD-10-CM

## 2018-03-12 NOTE — Progress Notes (Signed)
PATIENT: Ann Pace DOB: 10/09/1955  Chief Complaint  Patient presents with  . Numbness    Reports numbness/tingling in her bilateral feet.  Symptoms are causing her balance issues.  She is currently taking duloxetine 47m QD and gabapentin 3033mQHS.  She previously was unable to tolerate daytime doses of gabapentin due to excesssive drowsiness.  She is diabetic and her last A1C in 11/2017 was 7.6.  Her blood glucose has been much higher than normal due to a recent steroid injection in her shoulder.   . Marland KitchenCP    GoClent DemarkPA-C     HISTORICAL  Ann Pace a [age]ears old female, seen in request by her primary care PA GoClent Demarkfor evaluation of numbness, initial evaluation was on March 12, 2018.  I have reviewed and summarized the referring note from the referring physician.  She had a history of diabetes for more than 30 years, insulin-dependent, hypertension, hyperlipidemia.   Since 2009, she noticed gradual onset burning sensation deep achy pain at bilateral calf area, bilateral feet numbness, initially at the bottom of her feet, not ascending to bilateral ankle level now, she also noticed unsteady gait, denies bilateral upper extremity paresthesia, there was frequent bilateral calf muscle spasm.  She is now taking gabapentin 300 mg every night, which has helped, higher dose will cause excessive daytime drowsiness.  Lab, glucose 321, CBC 11.5, Hg 14.2, A1C 7.6, was 9.4  REVIEW OF SYSTEMS: Full 14 system review of systems performed and notable only for joint pain, aching muscles.  Loss of vision, fatigue All other review of systems were negative.  ALLERGIES: Allergies  Allergen Reactions  . Penicillins Other (See Comments)    Pt reports "pressure in her head" Has patient had a PCN reaction causing immediate rash, facial/tongue/throat swelling, SOB or lightheadedness with hypotension: No Has patient had a PCN reaction causing severe rash involving mucus  membranes or skin necrosis: No Has patient had a PCN reaction that required hospitalization: No Has patient had a PCN reaction occurring within the last 10 years: No If all of the above answers are "NO", then may proceed with Cephalosporin use.     HOME MEDICATIONS: Current Outpatient Medications  Medication Sig Dispense Refill  . acetaminophen (TYLENOL) 500 MG tablet Take 2 tablets (1,000 mg total) by mouth every 8 (eight) hours as needed. (Patient taking differently: Take 1,000 mg by mouth every 8 (eight) hours as needed for mild pain or headache. ) 84 tablet 0  . allopurinol (ZYLOPRIM) 300 MG tablet Take 1 tablet (300 mg total) by mouth daily. 30 tablet 11  . Blood Glucose Monitoring Suppl (ACCU-CHEK AVIVA PLUS) w/Device KIT 1 each by Does not apply route daily. 1 kit 0  . buPROPion (WELLBUTRIN XL) 300 MG 24 hr tablet Take 1 tablet (300 mg total) by mouth daily. 30 tablet 5  . celecoxib (CELEBREX) 200 MG capsule Take 1 capsule (200 mg total) by mouth 2 (two) times daily. (Patient taking differently: Take 200 mg by mouth daily. ) 60 capsule 2  . dapagliflozin propanediol (FARXIGA) 10 MG TABS tablet Take 10 mg by mouth daily. 30 tablet 11  . DULoxetine (CYMBALTA) 60 MG capsule Take 1 capsule (60 mg total) by mouth daily. 90 capsule 3  . gabapentin (NEURONTIN) 300 MG capsule Take 1 capsule (300 mg total) by mouth at bedtime. 30 capsule 11  . glucose blood (ACCU-CHEK AVIVA) test strip Use TID. 100 each 12  . insulin aspart (NOVOLOG  FLEXPEN) 100 UNIT/ML FlexPen Inject 2-10 Units into the skin See admin instructions. Inject 2-10 units subcutaneously three times daily before meals per sliding scale: CBG 150-200  2 units;  201-251  4 units; 251-300  6 units;  301-350  8 units;  351-400 take 10 units    . Insulin Glargine (LANTUS SOLOSTAR) 100 UNIT/ML Solostar Pen Inject 50 units at bedtime and 50 units 12 hours after bedtime dose. (Patient taking differently: Inject 50-60 Units into the skin See  admin instructions. Inject 60 units subcutaneously every morning and 50 units at night) 10 pen 11  . insulin lispro (HUMALOG KWIKPEN) 100 UNIT/ML KiwkPen If sugar 150-200 take 2 units If sugar 201-251 take 4 units If sugar 251-300 take 6 units If sugar 301-350 take 8 units If sugar 351-400 take 10 units 15 mL 11  . Insulin Pen Needle (PEN NEEDLES) 30G X 8 MM MISC Inject 1 application into the skin 5 (five) times daily. 150 each 11  . Lancets (ACCU-CHEK SOFT TOUCH) lancets Use TID. 100 each 12  . lisinopril (PRINIVIL,ZESTRIL) 10 MG tablet Take 1 tablet (10 mg total) by mouth daily. 90 tablet 3  . meloxicam (MOBIC) 15 MG tablet Take 1 tablet (15 mg total) by mouth daily. 30 tablet 0  . naproxen (NAPROSYN) 500 MG tablet Take 1 tablet (500 mg total) by mouth 2 (two) times daily with a meal. (Patient taking differently: Take 500 mg by mouth 2 (two) times daily as needed for moderate pain. ) 40 tablet 0  . omega-3 acid ethyl esters (LOVAZA) 1 g capsule Take 2 capsules (2 g total) by mouth 2 (two) times daily. 360 capsule 1  . rosuvastatin (CRESTOR) 20 MG tablet Take 1 tablet (20 mg total) by mouth daily. 90 tablet 1   No current facility-administered medications for this visit.     PAST MEDICAL HISTORY: Past Medical History:  Diagnosis Date  . Cataracts, bilateral   . Depression   . Diabetes mellitus without complication ([place])   . Neuropathy     PAST SURGICAL HISTORY: Past Surgical History:  Procedure Laterality Date  . ABDOMINAL HYSTERECTOMY    . CARPAL TUNNEL RELEASE Right   . KNEE SURGERY Bilateral     FAMILY HISTORY: Family History  Problem Relation Age of Onset  . Diabetes Mother   . Bladder Cancer Mother   . Depression Mother   . Hypertension Father   . Diabetes Sister   . Hypertension Sister   . Depression Sister   . Hypertension Sister   . Heart attack Brother 52  . Depression Brother   . Suicidality Brother   . Skin cancer Brother   . Other Brother        unsure  of history - says he does no go to the doctor  . Breast cancer Neg Hx     SOCIAL HISTORY: Social History   Socioeconomic History  . Marital status: Widowed    Spouse name: Not on file  . Number of children: 2  . Years of education: some college  . Highest education level: Not on file  Occupational History  . Occupation: Unemployed  Social Needs  . Financial resource strain: Not on file  . Food insecurity:    Worry: Not on file    Inability: Not on file  . Transportation needs:    Medical: Not on file    Non-medical: Not on file  Tobacco Use  . Smoking status: Never Smoker  . Smokeless tobacco: Never  Used  Substance and Sexual Activity  . Alcohol use: No  . Drug use: No  . Sexual activity: Not on file  Lifestyle  . Physical activity:    Days per week: Not on file    Minutes per session: Not on file  . Stress: Not on file  Relationships  . Social connections:    Talks on phone: Not on file    Gets together: Not on file    Attends religious service: Not on file    Active member of club or organization: Not on file    Attends meetings of clubs or organizations: Not on file    Relationship status: Not on file  . Intimate partner violence:    Fear of current or ex partner: Not on file    Emotionally abused: Not on file    Physically abused: Not on file    Forced sexual activity: Not on file  Other Topics Concern  . Not on file  Social History Narrative   Lives with her parents.    Right-handed.   No caffeine use.     PHYSICAL EXAM   Vitals:   03/12/18 0734  BP: 138/85  Pulse: 99  Weight: 191 lb 4 oz (86.8 kg)  Height: _0  (1.626 m)    Not recorded      Body mass index is 32.83 kg/m.  PHYSICAL EXAMNIATION:  Gen: NAD, conversant, well nourised, obese, well groomed                     Cardiovascular: Regular rate rhythm, no peripheral edema, warm, nontender. Eyes: Conjunctivae clear without exudates or hemorrhage Neck: Supple, no carotid  bruits. Pulmonary: Clear to auscultation bilaterally   NEUROLOGICAL EXAM:  MENTAL STATUS: Speech:    Speech is normal; fluent and spontaneous with normal comprehension.  Cognition:     Orientation to time, place and person     Normal recent and remote memory     Normal Attention span and concentration     Normal Language, naming, repeating,spontaneous speech     Fund of knowledge   CRANIAL NERVES: CN II: Visual fields are full to confrontation. Fundoscopic exam is normal with sharp discs and no vascular changes. Pupils are round equal and briskly reactive to light. CN III, IV, VI: extraocular movement are normal. No ptosis. CN V: Facial sensation is intact to pinprick in all 3 divisions bilaterally. Corneal responses are intact.  CN VII: Face is symmetric with normal eye closure and smile. CN VIII: Hearing is normal to rubbing fingers CN IX, X: Palate elevates symmetrically. Phonation is normal. CN XI: Head turning and shoulder shrug are intact CN XII: Tongue is midline with normal movements and no atrophy.  MOTOR: There is no pronator drift of out-stretched arms. Muscle bulk and tone are normal. Muscle strength is normal.  REFLEXES: Reflexes are 2+ and symmetric at the biceps, triceps, knees, and ankles. Plantar responses are flexor.  SENSORY: Length dependent decreased to light touch, pinprick to ankle level, decreased positional sensation and vibratory sensation at toes.  COORDINATION: Rapid alternating movements and fine finger movements are intact. There is no dysmetria on finger-to-nose and heel-knee-shin.    GAIT/STANCE: Posture is normal. Gait is steady with normal steps, base, arm swing, and turning. Heel and toe walking are normal. Tandem gait is normal.  Romberge was mildly positive  DIAGNOSTIC DATA (LABS, IMAGING, TESTING) - I reviewed patient records, labs, notes, testing and imaging myself where available.  ASSESSMENT AND PLAN  Ann Pace is a 62 y.o.  female   Peripheral neuropathy  Most consistent with her poorly controlled diabetes  EMG nerve conduction study  Laboratory evaluations to rule out other treatable etiology   Gabapentin 300 mg every night   Ann Pace, M.D. Ph.D.  [facility] [address] [place], [place] [address] Ph: [phone] Fax: [phone]  CC: Clent Demark, PA-C

## 2018-03-13 ENCOUNTER — Ambulatory Visit (INDEPENDENT_AMBULATORY_CARE_PROVIDER_SITE_OTHER): Payer: Self-pay | Admitting: Neurology

## 2018-03-13 DIAGNOSIS — G629 Polyneuropathy, unspecified: Secondary | ICD-10-CM

## 2018-03-13 DIAGNOSIS — Z0289 Encounter for other administrative examinations: Secondary | ICD-10-CM

## 2018-03-13 NOTE — Procedures (Signed)
Full Name: Ann Heaterhyl  Pace Gender: Female MRN #: [id number] Date of Birth: 02/18/1956    Visit Date: 03/13/2018 10:08 Age: 62 Years 10 Months Old Examining Physician: Levert FeinsteinYijun Nuriyah Hanline, MD  Referring Physician: Terrace ArabiaYan, MD History: 62 years old female, with history of poorly controlled diabetes, presenting with gradual worsening bilateral feet paresthesia  Summary of the tests: Nerve conduction study: Bilateral sural, superficial peroneal sensory responses were absent.  Bilateral peroneal to EDB, right tibial motor responses were normal.  Left tibial motor responses showed mildly decreased the C map amplitude, with mildly slow conduction velocity.  Left tibial F-wave latency was also 3.2 ms prolonged in comparison to right side.  Electromyography: Selective needle examinations were performed at right lower extremity muscles, the only abnormality is increased insertional activity complex motor unit potential with decreased recruitment patterns at right abductor hallucis.  Conclusion: This is an abnormal study.  There is electrodiagnostic evidence of mildly length dependent sensorimotor axonal polyneuropathy, consistent with her poorly controlled diabetes.    ------------------------------- Levert FeinsteinYIjun Kerri-Anne Haeberle M.D. PhD  [facility] [address] [place], [place] [address] Tel: [phone] Fax: [phone]        [facility]    Nerve / Sites Muscle Latency Ref. Amplitude Ref. Rel Amp Segments Distance Velocity Ref. Area    ms ms mV mV %  cm m/s m/s mVms  R Peroneal - EDB     Ankle EDB 5.2 ?6.5 4.1 ?2.0 100 Ankle - EDB 9   15.6     Fib head EDB 11.8  3.9  93.1 Fib head - Ankle 29 44 ?44 13.8     Pop fossa EDB 14.1  3.7  96.9 Pop fossa - Fib head 10 44 ?44 13.8         Pop fossa - Ankle      L Peroneal - EDB     Ankle EDB 5.4 ?6.5 3.6 ?2.0 100 Ankle - EDB 9   13.5     Fib head EDB 12.0  3.5  96.1 Fib head - Ankle 29 44 ?44 13.6     Pop fossa EDB 14.3  3.1  89.9 Pop fossa - Fib  head 10 44 ?44 12.9         Pop fossa - Ankle      R Tibial - AH     Ankle AH 3.8 ?5.8 4.3 ?4.0 100 Ankle - AH 9   9.4     Pop fossa AH 12.2  3.6  85.4 Pop fossa - Ankle 34 41 ?41 10.9  L Tibial - AH     Ankle AH 4.3 ?5.8 2.6 ?4.0 100 Ankle - AH 9   8.2     Pop fossa AH 14.1  2.8  106 Pop fossa - Ankle 34 35 ?41 6.8             SNC    Nerve / Sites Rec. Site Peak Lat Ref.  Amp Ref. Segments Distance    ms ms V V  cm  R Sural - Ankle (Calf)     Calf Ankle NR ?4.4 NR ?6 Calf - Ankle 14  L Sural - Ankle (Calf)     Calf Ankle NR ?4.4 NR ?6 Calf - Ankle 14  R Superficial peroneal - Ankle     Lat leg Ankle NR ?4.4 NR ?6 Lat leg - Ankle 14  L Superficial peroneal - Ankle     Lat leg Ankle NR ?4.4 NR ?  6 Lat leg - Ankle 14             F  Wave    Nerve F Lat Ref.   ms ms  R Tibial - AH 59.4 ?56.0  L Tibial - AH 62.6 ?56.0         EMG full       EMG Summary Table    Spontaneous MUAP Recruitment  Muscle IA Fib PSW Fasc Other Amp Dur. Poly Pattern  R. Abductor hallucis Increased None None None _______ Increased Increased 1+ Reduced  R. Tibialis anterior Normal None None None _______ Normal Normal Normal Normal  R. Tibialis posterior Normal None None None _______ Normal Normal Normal Normal  R. Peroneus longus Normal None None None _______ Normal Normal Normal Normal  R. Gastrocnemius (Medial head) Normal None None None _______ Normal Normal Normal Normal  R. Vastus lateralis Normal None None None _______ Normal Normal Normal Normal

## 2018-03-13 NOTE — Progress Notes (Signed)
EMG/NCS is under procedure tab  

## 2018-03-16 ENCOUNTER — Emergency Department (HOSPITAL_COMMUNITY): Payer: Self-pay

## 2018-03-16 ENCOUNTER — Other Ambulatory Visit: Payer: Self-pay

## 2018-03-16 ENCOUNTER — Emergency Department (HOSPITAL_COMMUNITY)
Admission: EM | Admit: 2018-03-16 | Discharge: 2018-03-17 | Disposition: A | Discharge status: Home / Self Care | Payer: Self-pay | Attending: Emergency Medicine | Admitting: Emergency Medicine

## 2018-03-16 ENCOUNTER — Encounter (HOSPITAL_COMMUNITY): Payer: Self-pay | Admitting: Emergency Medicine

## 2018-03-16 DIAGNOSIS — E119 Type 2 diabetes mellitus without complications: Secondary | ICD-10-CM | POA: Insufficient documentation

## 2018-03-16 DIAGNOSIS — M7918 Myalgia, other site: Secondary | ICD-10-CM | POA: Insufficient documentation

## 2018-03-16 DIAGNOSIS — Z79899 Other long term (current) drug therapy: Secondary | ICD-10-CM | POA: Insufficient documentation

## 2018-03-16 DIAGNOSIS — R0602 Shortness of breath: Secondary | ICD-10-CM | POA: Insufficient documentation

## 2018-03-16 DIAGNOSIS — R6889 Other general symptoms and signs: Secondary | ICD-10-CM

## 2018-03-16 DIAGNOSIS — R072 Precordial pain: Secondary | ICD-10-CM

## 2018-03-16 DIAGNOSIS — R6883 Chills (without fever): Secondary | ICD-10-CM | POA: Insufficient documentation

## 2018-03-16 DIAGNOSIS — R0789 Other chest pain: Secondary | ICD-10-CM | POA: Insufficient documentation

## 2018-03-16 DIAGNOSIS — Z794 Long term (current) use of insulin: Secondary | ICD-10-CM | POA: Insufficient documentation

## 2018-03-16 LAB — CBC
HCT: 41.8 % (ref 36.0–46.0)
Hemoglobin: 13.6 g/dL (ref 12.0–15.0)
MCH: 29.1 pg (ref 26.0–34.0)
MCHC: 32.5 g/dL (ref 30.0–36.0)
MCV: 89.5 fL (ref 80.0–100.0)
Platelets: 304 10*3/uL (ref 150–400)
RBC: 4.67 MIL/uL (ref 3.87–5.11)
RDW: 13.2 % (ref 11.5–15.5)
WBC: 10.9 10*3/uL — AB (ref 4.0–10.5)
nRBC: 0 % (ref 0.0–0.2)

## 2018-03-16 LAB — BASIC METABOLIC PANEL
Anion gap: 11 (ref 5–15)
BUN: 15 mg/dL (ref 6–20)
CO2: 24 mmol/L (ref 22–32)
Calcium: 9.8 mg/dL (ref 8.9–10.3)
Chloride: 101 mmol/L (ref 98–111)
Creatinine, Ser: 0.97 mg/dL (ref 0.44–1.00)
GFR calc Af Amer: >60 mL/min (ref >60)
Glucose, Bld: 261 mg/dL — ABNORMAL HIGH (ref 70–99)
Potassium: 4 mmol/L (ref 3.5–5.1)
SODIUM: 136 mmol/L (ref 135–145)

## 2018-03-16 LAB — VITAMIN B12: Vitamin B-12: 411 pg/mL (ref 232–1245)

## 2018-03-16 LAB — PROTEIN ELECTROPHORESIS
A/G Ratio: 1.4 (ref 0.7–1.7)
Albumin ELP: 3.6 g/dL (ref 2.9–4.4)
Alpha 1: 0.2 g/dL (ref 0.0–0.4)
Alpha 2: 1.1 g/dL — ABNORMAL HIGH (ref 0.4–1.0)
Beta: 0.9 g/dL (ref 0.7–1.3)
GLOBULIN, TOTAL: 2.6 g/dL (ref 2.2–3.9)
Gamma Globulin: 0.5 g/dL (ref 0.4–1.8)
Total Protein: 6.2 g/dL (ref 6.0–8.5)

## 2018-03-16 LAB — SEDIMENTATION RATE: Sed Rate: 21 mm/hr (ref 0–40)

## 2018-03-16 LAB — ANA W/REFLEX: Anti Nuclear Antibody(ANA): NEGATIVE

## 2018-03-16 LAB — HEMOGLOBIN A1C
Est. average glucose Bld gHb Est-mCnc: 280 mg/dL
Hgb A1c MFr Bld: 11.4 % — ABNORMAL HIGH (ref 4.8–5.6)

## 2018-03-16 LAB — COPPER, SERUM: Copper: 125 ug/dL (ref 72–166)

## 2018-03-16 LAB — C-REACTIVE PROTEIN: CRP: 10 mg/L (ref 0–10)

## 2018-03-16 LAB — VITAMIN D 25 HYDROXY (VIT D DEFICIENCY, FRACTURES): Vit D, 25-Hydroxy: 19.8 ng/mL — ABNORMAL LOW (ref 30.0–100.0)

## 2018-03-16 LAB — I-STAT TROPONIN, ED: Troponin i, poc: 0.02 ng/mL (ref 0.00–0.08)

## 2018-03-16 LAB — RPR: RPR: NONREACTIVE

## 2018-03-16 NOTE — ED Triage Notes (Signed)
Pt states she has had a non productive cough for a week and started having midsternal chest pain today at 1600. Pt states her chest hurts when she coughs and is sore to touch. Pt also having center back pain.

## 2018-03-17 MED ORDER — DIPHENHYDRAMINE HCL 50 MG/ML IJ SOLN
25.0000 mg | Freq: Once | INTRAMUSCULAR | Status: AC
  Administered 2018-03-17: 25 mg via INTRAVENOUS
  Filled 2018-03-17: qty 1

## 2018-03-17 MED ORDER — METOCLOPRAMIDE HCL 5 MG/ML IJ SOLN
10.0000 mg | Freq: Once | INTRAMUSCULAR | Status: AC
  Administered 2018-03-17: 10 mg via INTRAVENOUS
  Filled 2018-03-17: qty 2

## 2018-03-17 MED ORDER — KETOROLAC TROMETHAMINE 30 MG/ML IJ SOLN
30.0000 mg | Freq: Once | INTRAMUSCULAR | Status: AC
  Administered 2018-03-17: 30 mg via INTRAVENOUS
  Filled 2018-03-17: qty 1

## 2018-03-17 MED ORDER — SODIUM CHLORIDE 0.9 % IV BOLUS (SEPSIS)
1000.0000 mL | Freq: Once | INTRAVENOUS | Status: AC
  Administered 2018-03-17: 1000 mL via INTRAVENOUS

## 2018-03-17 NOTE — ED Provider Notes (Signed)
[facility] DEPARTMENT Provider Note   CSN: [id number] Arrival date & time: 03/16/18  1941     History   Chief Complaint Chief Complaint  Patient presents with  . Chest Pain  . Cough    HPI Elfreida Schlender is a 62 y.o. female.  The history is provided by the patient and a relative.  Cough  This is a new problem. The current episode started more than 2 days ago. The problem occurs every few minutes. The problem has been gradually worsening. The cough is non-productive. There has been no fever. Associated symptoms include chest pain, chills, sweats, sore throat, myalgias and shortness of breath. She is not a smoker.  Patient reports flulike illness for over 3 days.  She reports it for started sore throat and then progressed to a nonproductive cough.  She denies hemoptysis.  She reports now she is having myalgias, diaphoresis and headache.  She reports her headache is worse with cough.  She is also reporting central chest pain with cough and palpation.  No fevers recorded   Past Medical History:  Diagnosis Date  . Cataracts, bilateral   . Depression   . Diabetes mellitus without complication ([place])   . Neuropathy     Patient Active Problem List   Diagnosis Date Noted  . Peripheral polyneuropathy 03/12/2018  . Type 2 diabetes mellitus ([place]) 05/15/2017    Past Surgical History:  Procedure Laterality Date  . ABDOMINAL HYSTERECTOMY    . CARPAL TUNNEL RELEASE Right   . KNEE SURGERY Bilateral      OB History    Gravida  2   Para      Term      Preterm      AB      Living  2     SAB      TAB      Ectopic      Multiple      Live Births  2            Home Medications    Prior to Admission medications   Medication Sig Start Date End Date Taking? Authorizing Provider  acetaminophen (TYLENOL) 500 MG tablet Take 2 tablets (1,000 mg total) by mouth every 8 (eight) hours as needed. Patient taking differently: Take 1,000 mg by  mouth every 8 (eight) hours as needed for mild pain or headache.  02/06/18   Clent Demark, PA-C  allopurinol (ZYLOPRIM) 300 MG tablet Take 1 tablet (300 mg total) by mouth daily. 05/15/17   Clent Demark, PA-C  Blood Glucose Monitoring Suppl (ACCU-CHEK AVIVA PLUS) w/Device KIT 1 each by Does not apply route daily. 10/07/17   Clent Demark, PA-C  buPROPion (WELLBUTRIN XL) 300 MG 24 hr tablet Take 1 tablet (300 mg total) by mouth daily. 05/15/17   Clent Demark, PA-C  celecoxib (CELEBREX) 200 MG capsule Take 1 capsule (200 mg total) by mouth 2 (two) times daily. Patient taking differently: Take 200 mg by mouth daily.  06/12/17   Clent Demark, PA-C  dapagliflozin propanediol (FARXIGA) 10 MG TABS tablet Take 10 mg by mouth daily. 05/15/17   Clent Demark, PA-C  DULoxetine (CYMBALTA) 60 MG capsule Take 1 capsule (60 mg total) by mouth daily. 05/15/17   Clent Demark, PA-C  gabapentin (NEURONTIN) 300 MG capsule Take 1 capsule (300 mg total) by mouth at bedtime. 10/07/17   Clent Demark, PA-C  glucose blood (ACCU-CHEK AVIVA) test strip Use  TID. 10/07/17   Clent Demark, PA-C  insulin aspart (NOVOLOG FLEXPEN) 100 UNIT/ML FlexPen Inject 2-10 Units into the skin See admin instructions. Inject 2-10 units subcutaneously three times daily before meals per sliding scale: CBG 150-200  2 units;  201-251  4 units; 251-300  6 units;  301-350  8 units;  351-400 take 10 units    [provider]  Insulin Glargine (LANTUS SOLOSTAR) 100 UNIT/ML Solostar Pen Inject 50 units at bedtime and 50 units 12 hours after bedtime dose. Patient taking differently: Inject 50-60 Units into the skin See admin instructions. Inject 60 units subcutaneously every morning and 50 units at night 10/07/17   Clent Demark, PA-C  insulin lispro (HUMALOG [facility]) 100 UNIT/ML KiwkPen If sugar 150-200 take 2 units If sugar 201-251 take 4 units If sugar 251-300 take 6 units If sugar 301-350 take 8  units If sugar 351-400 take 10 units 06/12/17   Clent Demark, PA-C  Insulin Pen Needle (PEN NEEDLES) 30G X 8 MM MISC Inject 1 application into the skin 5 (five) times daily. 06/12/17   Clent Demark, PA-C  Lancets [facility]) lancets Use TID. 10/07/17   Clent Demark, PA-C  lisinopril (PRINIVIL,ZESTRIL) 10 MG tablet Take 1 tablet (10 mg total) by mouth daily. 05/15/17   Clent Demark, PA-C  meloxicam (MOBIC) 15 MG tablet Take 1 tablet (15 mg total) by mouth daily. 02/06/18   Clent Demark, PA-C  naproxen (NAPROSYN) 500 MG tablet Take 1 tablet (500 mg total) by mouth 2 (two) times daily with a meal. Patient taking differently: Take 500 mg by mouth 2 (two) times daily as needed for moderate pain.  07/15/17   Clent Demark, PA-C  omega-3 acid ethyl esters (LOVAZA) 1 g capsule Take 2 capsules (2 g total) by mouth 2 (two) times daily. 11/27/17   Clent Demark, PA-C  rosuvastatin (CRESTOR) 20 MG tablet Take 1 tablet (20 mg total) by mouth daily. 11/27/17   Clent Demark, PA-C    Family History Family History  Problem Relation Age of Onset  . Diabetes Mother   . Bladder Cancer Mother   . Depression Mother   . Hypertension Father   . Diabetes Sister   . Hypertension Sister   . Depression Sister   . Hypertension Sister   . Heart attack Brother 77  . Depression Brother   . Suicidality Brother   . Skin cancer Brother   . Other Brother        unsure of history - says he does no go to the doctor  . Breast cancer Neg Hx     Social History Social History   Tobacco Use  . Smoking status: Never Smoker  . Smokeless tobacco: Never Used  Substance Use Topics  . Alcohol use: No  . Drug use: No     Allergies   Penicillins   Review of Systems Review of Systems  Constitutional: Positive for chills, diaphoresis and fatigue.  HENT: Positive for sore throat.   Respiratory: Positive for cough and shortness of breath.   Cardiovascular: Positive for  chest pain.  Gastrointestinal: Negative for vomiting.  Musculoskeletal: Positive for myalgias.  All other systems reviewed and are negative.    Physical Exam Updated Vital Signs BP 124/83 (BP Location: Left Arm)   Pulse 98   Temp 98.4 F (36.9 C) (Oral)   Resp 15   Ht 1.626 m (5' 4" )   Wt 83 kg  SpO2 100%   BMI 31.41 kg/m   Physical Exam CONSTITUTIONAL: Well developed/well nourished HEAD: Normocephalic/atraumatic EYES: EOMI/PERRL ENMT: Mucous membranes moist uvula midline, no erythema or exudates NECK: supple no meningeal signs SPINE/BACK:entire spine nontender CV: S1/S2 noted, no murmurs/rubs/gallops noted LUNGS: Lungs are clear to auscultation bilaterally, no apparent distress Chest-central chest wall tenderness without crepitus ABDOMEN: soft, nontender, no rebound or guarding, bowel sounds noted throughout abdomen GU:no cva tenderness NEURO: Pt is awake/alert/appropriate, moves all extremitiesx4.  No facial droop.   EXTREMITIES: pulses normal/equal, full ROM, no significant lower extremity SKIN: warm, color normal PSYCH: no abnormalities of mood noted, alert and oriented to situation   ED Treatments / Results  Labs (all labs ordered are listed, but only abnormal results are displayed) Labs Reviewed  BASIC METABOLIC PANEL - Abnormal; Notable for the following components:      Result Value   Glucose, Bld 261 (*)    All other components within normal limits  CBC - Abnormal; Notable for the following components:   WBC 10.9 (*)    All other components within normal limits  I-STAT TROPONIN, ED    EKG EKG Interpretation  Date/Time:  Monday March 16 2018 19:46:20 EST Ventricular Rate:  98 PR Interval:  142 QRS Duration: 92 QT Interval:  360 QTC Calculation: 459 R Axis:   143 Text Interpretation:  Normal sinus rhythm Right axis deviation Possible Anterior infarct , age undetermined Abnormal ECG No significant change since last tracing Confirmed by Ripley Fraise [id number]) on 03/17/2018 1:53:01 AM   Radiology Dg Chest 2 View  Result Date: 03/16/2018 CLINICAL DATA:  Right-sided chest pain and cough for few days EXAM: CHEST - 2 VIEW COMPARISON:  03/02/2018 FINDINGS: Cardiac shadows within normal limits. The lungs are well aerated bilaterally. No focal infiltrate or sizable effusion is noted. No acute bony abnormality is seen. IMPRESSION: No active cardiopulmonary disease. Electronically Signed   By: Inez Catalina M.D.   On: 03/16/2018 20:37    Procedures Procedures (including critical care time)  Medications Ordered in ED Medications  sodium chloride 0.9 % bolus 1,000 mL (0 mLs Intravenous Stopped 03/17/18 0301)  ketorolac (TORADOL) 30 MG/ML injection 30 mg (30 mg Intravenous Given 03/17/18 0231)  metoCLOPramide (REGLAN) injection 10 mg (10 mg Intravenous Given 03/17/18 0350)  diphenhydrAMINE (BENADRYL) injection 25 mg (25 mg Intravenous Given 03/17/18 0348)     Initial Impression / Assessment and Plan / ED Course  I have reviewed the triage vital signs and the nursing notes.  Pertinent labs & imaging results that were available during my care of the patient were reviewed by me and considered in my medical decision making (see chart for details).     2:23 AM Patient presents with flulike illness for over 3 days.  She has had cough/myalgias/sore throat/diaphoresis and headache.  No fever at this time.  She is nontoxic in appearance, but will treat symptoms.  She is not a candidate for Tamiflu. 4:28 AM Patient feels much improved.  Her vital signs are improved.  She will suspicion this is all viral illness/flulike illness.  No signs of ACS/PE. We discussed strict ER return precautions.  Patient is agreeable to plan Final Clinical Impressions(s) / ED Diagnoses   Final diagnoses:  Flu-like symptoms  Precordial pain    ED Discharge Orders    None       Ripley Fraise, MD 03/17/18 [phone]

## 2018-04-06 ENCOUNTER — Encounter (HOSPITAL_COMMUNITY): Payer: Self-pay

## 2018-04-06 ENCOUNTER — Ambulatory Visit (HOSPITAL_COMMUNITY)
Admission: EM | Admit: 2018-04-06 | Discharge: 2018-04-06 | Disposition: A | Discharge status: Home / Self Care | Payer: Self-pay | Attending: Urgent Care | Admitting: Urgent Care

## 2018-04-06 ENCOUNTER — Other Ambulatory Visit: Payer: Self-pay

## 2018-04-06 DIAGNOSIS — N3090 Cystitis, unspecified without hematuria: Secondary | ICD-10-CM

## 2018-04-06 DIAGNOSIS — N10 Acute tubulo-interstitial nephritis: Secondary | ICD-10-CM

## 2018-04-06 DIAGNOSIS — N12 Tubulo-interstitial nephritis, not specified as acute or chronic: Secondary | ICD-10-CM | POA: Insufficient documentation

## 2018-04-06 DIAGNOSIS — E1165 Type 2 diabetes mellitus with hyperglycemia: Secondary | ICD-10-CM

## 2018-04-06 LAB — POCT URINALYSIS DIP (DEVICE)
Bilirubin Urine: NEGATIVE
Glucose, UA: >=1000 mg/dL — AB
Ketones, ur: NEGATIVE mg/dL
Leukocytes, UA: NEGATIVE
Nitrite: POSITIVE — AB
Protein, ur: NEGATIVE mg/dL
Urobilinogen, UA: 1 mg/dL (ref 0.0–1.0)
pH: 5 (ref 5.0–8.0)

## 2018-04-06 MED ORDER — FLUCONAZOLE 150 MG PO TABS: 150.0000 mg | ORAL_TABLET | ORAL | 0 refills | Status: DC

## 2018-04-06 MED ORDER — CEFTRIAXONE SODIUM 1 G IJ SOLR
1.0000 g | Freq: Once | INTRAMUSCULAR | Status: AC
  Administered 2018-04-06: 1 g via INTRAMUSCULAR

## 2018-04-06 MED ORDER — SULFAMETHOXAZOLE-TRIMETHOPRIM 800-160 MG PO TABS: 1.0000 | ORAL_TABLET | Freq: Two times a day (BID) | ORAL | 0 refills | Status: DC

## 2018-04-06 MED ORDER — CEFTRIAXONE SODIUM 1 G IJ SOLR
INTRAMUSCULAR | Status: AC
  Filled 2018-04-06: qty 10

## 2018-04-06 MED ORDER — LIDOCAINE HCL (PF) 1 % IJ SOLN
INTRAMUSCULAR | Status: AC
  Filled 2018-04-06: qty 2

## 2018-04-06 NOTE — ED Provider Notes (Signed)
MRN: [id number] DOB: 08-06-1955  Subjective:   Ann Pace is a 63 y.o. female presenting for 2 day history of worsening, constant, moderate dysuria, urinary frequency, urinary urgency and intermittent low back discomfort. Has tried Azo with some relief. Denies fever, hematuria, flank pain, abdominal pain, pelvic pain, cloudy malordorous urine, genital rash and vaginal discharge, nausea and vomiting.  Patient has uncontrolled diabetes, no insurance to help her afford her medications including her insulin.  She has been keeping her blood sugar readings in the 200s.  Last A1c was 11.4% on 03/12/2018. Denies smoking cigarettes.   No current facility-administered medications for this encounter.   Current Outpatient Medications:  .  acetaminophen (TYLENOL) 500 MG tablet, Take 2 tablets (1,000 mg total) by mouth every 8 (eight) hours as needed. (Patient taking differently: Take 1,000 mg by mouth every 8 (eight) hours as needed for mild pain or headache. ), Disp: 84 tablet, Rfl: 0 .  allopurinol (ZYLOPRIM) 300 MG tablet, Take 1 tablet (300 mg total) by mouth daily., Disp: 30 tablet, Rfl: 11 .  Blood Glucose Monitoring Suppl (ACCU-CHEK AVIVA PLUS) w/Device KIT, 1 each by Does not apply route daily., Disp: 1 kit, Rfl: 0 .  buPROPion (WELLBUTRIN XL) 300 MG 24 hr tablet, Take 1 tablet (300 mg total) by mouth daily., Disp: 30 tablet, Rfl: 5 .  celecoxib (CELEBREX) 200 MG capsule, Take 1 capsule (200 mg total) by mouth 2 (two) times daily. (Patient taking differently: Take 200 mg by mouth daily. ), Disp: 60 capsule, Rfl: 2 .  dapagliflozin propanediol (FARXIGA) 10 MG TABS tablet, Take 10 mg by mouth daily., Disp: 30 tablet, Rfl: 11 .  DULoxetine (CYMBALTA) 60 MG capsule, Take 1 capsule (60 mg total) by mouth daily., Disp: 90 capsule, Rfl: 3 .  gabapentin (NEURONTIN) 300 MG capsule, Take 1 capsule (300 mg total) by mouth at bedtime., Disp: 30 capsule, Rfl: 11 .  glucose blood (ACCU-CHEK AVIVA) test strip, Use  TID., Disp: 100 each, Rfl: 12 .  insulin aspart (NOVOLOG FLEXPEN) 100 UNIT/ML FlexPen, Inject 2-10 Units into the skin See admin instructions. Inject 2-10 units subcutaneously three times daily before meals per sliding scale: CBG 150-200  2 units;  201-251  4 units; 251-300  6 units;  301-350  8 units;  351-400 take 10 units, Disp: , Rfl:  .  Insulin Glargine (LANTUS SOLOSTAR) 100 UNIT/ML Solostar Pen, Inject 50 units at bedtime and 50 units 12 hours after bedtime dose. (Patient taking differently: Inject 50-60 Units into the skin See admin instructions. Inject 60 units subcutaneously every morning and 50 units at night), Disp: 10 pen, Rfl: 11 .  insulin lispro (HUMALOG KWIKPEN) 100 UNIT/ML KiwkPen, If sugar 150-200 take 2 units If sugar 201-251 take 4 units If sugar 251-300 take 6 units If sugar 301-350 take 8 units If sugar 351-400 take 10 units, Disp: 15 mL, Rfl: 11 .  Insulin Pen Needle (PEN NEEDLES) 30G X 8 MM MISC, Inject 1 application into the skin 5 (five) times daily., Disp: 150 each, Rfl: 11 .  Lancets (ACCU-CHEK SOFT TOUCH) lancets, Use TID., Disp: 100 each, Rfl: 12 .  lisinopril (PRINIVIL,ZESTRIL) 10 MG tablet, Take 1 tablet (10 mg total) by mouth daily., Disp: 90 tablet, Rfl: 3 .  meloxicam (MOBIC) 15 MG tablet, Take 1 tablet (15 mg total) by mouth daily., Disp: 30 tablet, Rfl: 0 .  naproxen (NAPROSYN) 500 MG tablet, Take 1 tablet (500 mg total) by mouth 2 (two) times daily with a meal. (Patient  taking differently: Take 500 mg by mouth 2 (two) times daily as needed for moderate pain. ), Disp: 40 tablet, Rfl: 0 .  omega-3 acid ethyl esters (LOVAZA) 1 g capsule, Take 2 capsules (2 g total) by mouth 2 (two) times daily., Disp: 360 capsule, Rfl: 1 .  rosuvastatin (CRESTOR) 20 MG tablet, Take 1 tablet (20 mg total) by mouth daily., Disp: 90 tablet, Rfl: 1   Allergies  Allergen Reactions  . Penicillins Other (See Comments)    Pt reports "pressure in her head" Has patient had a PCN reaction  causing immediate rash, facial/tongue/throat swelling, SOB or lightheadedness with hypotension: No Has patient had a PCN reaction causing severe rash involving mucus membranes or skin necrosis: No Has patient had a PCN reaction that required hospitalization: No Has patient had a PCN reaction occurring within the last 10 years: No If all of the above answers are "NO", then may proceed with Cephalosporin use.     Past Medical History:  Diagnosis Date  . Cataracts, bilateral   . Depression   . Diabetes mellitus without complication ([facility])   . Neuropathy      Past Surgical History:  Procedure Laterality Date  . ABDOMINAL HYSTERECTOMY    . CARPAL TUNNEL RELEASE Right   . KNEE SURGERY Bilateral     Objective:   Vitals: BP (!) 146/95 (BP Location: Right Arm)   Pulse 100   Temp 97.6 F (36.4 C) (Oral)   Resp 18   Wt 189 lb (85.7 kg)   SpO2 98%   BMI 32.44 kg/m   Physical Exam Constitutional:      General: She is not in acute distress.    Appearance: Normal appearance. She is well-developed. She is not ill-appearing, toxic-appearing or diaphoretic.  HENT:     Head: Normocephalic and atraumatic.     Nose: Nose normal.     Mouth/Throat:     Mouth: Mucous membranes are moist.  Eyes:     Extraocular Movements: Extraocular movements intact.     Pupils: Pupils are equal, round, and reactive to light.  Cardiovascular:     Rate and Rhythm: Normal rate and regular rhythm.     Pulses: Normal pulses.     Heart sounds: Normal heart sounds. No murmur. No friction rub. No gallop.   Pulmonary:     Effort: Pulmonary effort is normal. No respiratory distress.     Breath sounds: Normal breath sounds. No stridor. No wheezing, rhonchi or rales.  Abdominal:     General: There is no distension.     Palpations: There is no mass.     Tenderness: There is no abdominal tenderness. There is right CVA tenderness. There is no left CVA tenderness, guarding or rebound.  Skin:    General: Skin is  warm and dry.     Findings: No rash.  Neurological:     Mental Status: She is alert and oriented to person, place, and time.  Psychiatric:        Mood and Affect: Mood normal.        Behavior: Behavior normal.        Thought Content: Thought content normal.     Results for orders placed or performed during the hospital encounter of 04/06/18 (from the past 24 hour(s))  POCT urinalysis dip (device)     Status: Abnormal   Collection Time: 04/06/18  5:15 PM  Result Value Ref Range   Glucose, UA >=1000 (A) NEGATIVE mg/dL   Bilirubin Urine NEGATIVE  NEGATIVE   Ketones, ur NEGATIVE NEGATIVE mg/dL   Specific Gravity, Urine <=1.005 1.005 - 1.030   Hgb urine dipstick TRACE (A) NEGATIVE   pH 5.0 5.0 - 8.0   Protein, ur NEGATIVE NEGATIVE mg/dL   Urobilinogen, UA 1.0 0.0 - 1.0 mg/dL   Nitrite POSITIVE (A) NEGATIVE   Leukocytes, UA NEGATIVE NEGATIVE    Assessment and Plan :   Cystitis  Pyelonephritis  Will cover for pyelonephritis with IM ceftriaxone.  Urine culture pending, will start patient on Bactrim.  Patient is to continue monitoring her blood sugar.  Last basic metabolic panel showed normal renal function.  Strict ER precautions given.   Jaynee Eagles, [place] 04/06/18 1811

## 2018-04-06 NOTE — ED Triage Notes (Signed)
Pt cc she thinks she has a UTI. Pt has been voiding a lot and she has been using AZO. X 2 days.

## 2018-04-07 MED FILL — FLUCONAZOLE 150 MG TABS: 150 | 7 days supply | Qty: 1 | Fill #0

## 2018-04-07 MED FILL — ALLOPURINOL 300 MG TAB: 300 | 30 days supply | Qty: 30 | Fill #6

## 2018-04-07 MED FILL — LISINOPRIL 10 MG TABS: 10 | 30 days supply | Qty: 30 | Fill #4

## 2018-04-07 MED FILL — SULFAMETHOXAZOLE-TMP DS TAB: 800-160 | 10 days supply | Qty: 20 | Fill #0

## 2018-04-08 ENCOUNTER — Ambulatory Visit (HOSPITAL_COMMUNITY)
Admission: EM | Admit: 2018-04-08 | Discharge: 2018-04-08 | Disposition: A | Discharge status: Home / Self Care | Payer: Self-pay | Attending: Family Medicine | Admitting: Family Medicine

## 2018-04-08 LAB — URINE CULTURE

## 2018-04-08 NOTE — ED Notes (Signed)
Discussed with brittany, PA.  Patient is not feeling any better.  Started medicine yesterday, no improvement today, but no worse today either. Explained we have no other treatment options for her diagnosis at this location.   Discussed waiting a day to see impact and go to ed if any worse.  Patient opted to go to ed now.

## 2018-04-09 ENCOUNTER — Other Ambulatory Visit: Payer: Self-pay

## 2018-04-09 ENCOUNTER — Ambulatory Visit (INDEPENDENT_AMBULATORY_CARE_PROVIDER_SITE_OTHER): Payer: Self-pay | Admitting: Critical Care Medicine

## 2018-04-09 ENCOUNTER — Emergency Department (HOSPITAL_COMMUNITY)
Admission: EM | Admit: 2018-04-09 | Discharge: 2018-04-10 | Disposition: A | Discharge status: Home / Self Care | Payer: Self-pay | Attending: Emergency Medicine | Admitting: Emergency Medicine

## 2018-04-09 ENCOUNTER — Encounter (INDEPENDENT_AMBULATORY_CARE_PROVIDER_SITE_OTHER): Payer: Self-pay | Admitting: Critical Care Medicine

## 2018-04-09 VITALS — BP 130/70 | HR 105 | Temp 98.0°F | Ht 64.0 in | Wt 184.2 lb

## 2018-04-09 DIAGNOSIS — N3000 Acute cystitis without hematuria: Secondary | ICD-10-CM | POA: Insufficient documentation

## 2018-04-09 DIAGNOSIS — R000 Tachycardia, unspecified: Secondary | ICD-10-CM | POA: Insufficient documentation

## 2018-04-09 DIAGNOSIS — E1142 Type 2 diabetes mellitus with diabetic polyneuropathy: Secondary | ICD-10-CM

## 2018-04-09 DIAGNOSIS — M545 Low back pain: Secondary | ICD-10-CM | POA: Insufficient documentation

## 2018-04-09 DIAGNOSIS — E861 Hypovolemia: Secondary | ICD-10-CM

## 2018-04-09 DIAGNOSIS — I9589 Other hypotension: Secondary | ICD-10-CM

## 2018-04-09 DIAGNOSIS — I10 Essential (primary) hypertension: Secondary | ICD-10-CM

## 2018-04-09 DIAGNOSIS — R5383 Other fatigue: Secondary | ICD-10-CM | POA: Insufficient documentation

## 2018-04-09 DIAGNOSIS — R110 Nausea: Secondary | ICD-10-CM | POA: Insufficient documentation

## 2018-04-09 DIAGNOSIS — Z794 Long term (current) use of insulin: Secondary | ICD-10-CM

## 2018-04-09 DIAGNOSIS — N390 Urinary tract infection, site not specified: Secondary | ICD-10-CM

## 2018-04-09 DIAGNOSIS — E119 Type 2 diabetes mellitus without complications: Secondary | ICD-10-CM | POA: Insufficient documentation

## 2018-04-09 LAB — POCT URINALYSIS DIP (CLINITEK)
Bilirubin, UA: NEGATIVE
Glucose, UA: >=1000 mg/dL — AB
Ketones, POC UA: NEGATIVE mg/dL
Leukocytes, UA: NEGATIVE
Nitrite, UA: NEGATIVE
POC PROTEIN,UA: NEGATIVE
Spec Grav, UA: <=1.005 — AB (ref 1.010–1.025)
UROBILINOGEN UA: 0.2 U/dL
pH, UA: 5 (ref 5.0–8.0)

## 2018-04-09 LAB — CBC WITH DIFFERENTIAL/PLATELET
Abs Immature Granulocytes: 0.1 10*3/uL — ABNORMAL HIGH (ref 0.00–0.07)
Basophils Absolute: 0 10*3/uL (ref 0.0–0.1)
Basophils Relative: 0 %
Eosinophils Absolute: 0.1 10*3/uL (ref 0.0–0.5)
Eosinophils Relative: 1 %
HCT: 47.1 % — ABNORMAL HIGH (ref 36.0–46.0)
Hemoglobin: 15.1 g/dL — ABNORMAL HIGH (ref 12.0–15.0)
IMMATURE GRANULOCYTES: 1 %
Lymphocytes Relative: 28 %
Lymphs Abs: 3.4 10*3/uL (ref 0.7–4.0)
MCH: 27.9 pg (ref 26.0–34.0)
MCHC: 32.1 g/dL (ref 30.0–36.0)
MCV: 86.9 fL (ref 80.0–100.0)
Monocytes Absolute: 0.6 10*3/uL (ref 0.1–1.0)
Monocytes Relative: 5 %
NEUTROS PCT: 65 %
Neutro Abs: 7.6 10*3/uL (ref 1.7–7.7)
PLATELETS: 352 10*3/uL (ref 150–400)
RBC: 5.42 MIL/uL — ABNORMAL HIGH (ref 3.87–5.11)
RDW: 12.9 % (ref 11.5–15.5)
WBC: 11.9 10*3/uL — ABNORMAL HIGH (ref 4.0–10.5)
nRBC: 0 % (ref 0.0–0.2)

## 2018-04-09 LAB — URINALYSIS, ROUTINE W REFLEX MICROSCOPIC
Bilirubin Urine: NEGATIVE
Glucose, UA: >=500 mg/dL — AB
Hgb urine dipstick: NEGATIVE
Ketones, ur: NEGATIVE mg/dL
Leukocytes, UA: NEGATIVE
Nitrite: NEGATIVE
Protein, ur: NEGATIVE mg/dL
Specific Gravity, Urine: 1.03 (ref 1.005–1.030)
pH: 5 (ref 5.0–8.0)

## 2018-04-09 LAB — COMPREHENSIVE METABOLIC PANEL
ALT: 19 U/L (ref 0–44)
AST: 15 U/L (ref 15–41)
Albumin: 3.8 g/dL (ref 3.5–5.0)
Alkaline Phosphatase: 149 U/L — ABNORMAL HIGH (ref 38–126)
Anion gap: 13 (ref 5–15)
BUN: 22 mg/dL — ABNORMAL HIGH (ref 6–20)
CO2: 25 mmol/L (ref 22–32)
Calcium: 10.4 mg/dL — ABNORMAL HIGH (ref 8.9–10.3)
Chloride: 96 mmol/L — ABNORMAL LOW (ref 98–111)
Creatinine, Ser: 1.4 mg/dL — ABNORMAL HIGH (ref 0.44–1.00)
GFR calc Af Amer: 47 mL/min — ABNORMAL LOW (ref >60)
GFR calc non Af Amer: 41 mL/min — ABNORMAL LOW (ref >60)
Glucose, Bld: 402 mg/dL — ABNORMAL HIGH (ref 70–99)
Potassium: 4.1 mmol/L (ref 3.5–5.1)
SODIUM: 134 mmol/L — AB (ref 135–145)
Total Bilirubin: 0.4 mg/dL (ref 0.3–1.2)
Total Protein: 6.9 g/dL (ref 6.5–8.1)

## 2018-04-09 MED ORDER — SODIUM CHLORIDE 0.9% FLUSH
3.0000 mL | Freq: Once | INTRAVENOUS | Status: AC
  Administered 2018-04-09: 3 mL via INTRAVENOUS

## 2018-04-09 MED ORDER — ONDANSETRON HCL 4 MG/2ML IJ SOLN
4.0000 mg | Freq: Once | INTRAMUSCULAR | Status: AC
  Administered 2018-04-09: 4 mg via INTRAVENOUS
  Filled 2018-04-09: qty 2

## 2018-04-09 MED ORDER — LACTATED RINGERS IV BOLUS
1000.0000 mL | Freq: Once | INTRAVENOUS | Status: AC
  Administered 2018-04-10: 1000 mL via INTRAVENOUS

## 2018-04-09 NOTE — Patient Instructions (Signed)
Go to the ED for evaluation and IV fluids.

## 2018-04-09 NOTE — Assessment & Plan Note (Signed)
Diabetes mellitus poorly controlled likely exacerbated by ongoing renal infection

## 2018-04-09 NOTE — Assessment & Plan Note (Signed)
Currently patient has volume depletion and in the face of taking antihypertensives has orthostatic hypotension

## 2018-04-09 NOTE — ED Triage Notes (Signed)
Pt endorses being diagnosed with a UTI 2 days ago and placed on bactrim. Pt still having burning and irritation, also tachycardic and sent here due to high heart rate. Afebrile. Axox4.

## 2018-04-09 NOTE — Assessment & Plan Note (Signed)
While Bactrim was adequate coverage concerned about obstructive process in the right kidney which may need imaging studies

## 2018-04-09 NOTE — ED Provider Notes (Signed)
Emergency Department Provider Note   I have reviewed the triage vital signs and the nursing notes.   HISTORY  Chief Complaint Urinary Tract Infection and Tachycardia   HPI Ann Pace is a 63 y.o. female who was diagnosed with a urinary tract infection a few days ago and started Bactrim Tuesday.  Took 2 doses Tuesday, Wednesday and 1 dose this morning.  Has had some right-sided back pain with this as well.  She is had some nausea, general malaise and weakness.  No other associated symptoms.  She went back to urgent care yesterday where they said there is not much they could do for and to go to the ER.  Not much change since then.  No fevers.  No history of the same. No other associated or modifying symptoms.    Past Medical History:  Diagnosis Date  . Cataracts, bilateral   . Depression   . Diabetes mellitus without complication (HCC)   . Neuropathy     Patient Active Problem List   Diagnosis Date Noted  . Hypotension due to hypovolemia 04/09/2018  . Urinary tract infection without hematuria 04/09/2018  . Peripheral polyneuropathy 03/12/2018  . Type 2 diabetes mellitus (HCC) 05/15/2017  . Dyslipidemia associated with type 2 diabetes mellitus (HCC) 12/12/2015  . Essential hypertension 12/12/2015    Past Surgical History:  Procedure Laterality Date  . ABDOMINAL HYSTERECTOMY    . CARPAL TUNNEL RELEASE Right   . KNEE SURGERY Bilateral     Current Outpatient Rx  . Order #: [id number] Class: Normal  . Order #: [id number] Class: Normal  . Order #: [id number] Class: Print  . Order #: [id number] Class: Normal  . Order #: [id number] Class: Normal  . Order #: [id number] Class: Normal  . Order #: [id number] Class: Historical Med  . Order #: [id number] Class: Normal  . Order #: [id number] Class: Normal  . Order #: [id number] Class: Historical Med  . Order #: [id number] Class: Normal  . Order #: [id number] Class: Normal  . Order #: [id number] Class: Normal  . Order #: [id number] Class: Normal  .  Order #: [id number] Class: Normal  . Order #: [id number] Class: Normal  . Order #: [id number] Class: Normal  . Order #: [id number] Class: Print  . Order #: [id number] Class: Normal  . Order #: [id number] Class: Normal    Allergies Cortisone and Penicillins  Family History  Problem Relation Age of Onset  . Diabetes Mother   . Bladder Cancer Mother   . Depression Mother   . Hypertension Father   . Diabetes Sister   . Hypertension Sister   . Depression Sister   . Hypertension Sister   . Heart attack Brother 42  . Depression Brother   . Suicidality Brother   . Skin cancer Brother   . Other Brother        unsure of history - says he does no go to the doctor  . Breast cancer Neg Hx     Social History Social History   Tobacco Use  . Smoking status: Never Smoker  . Smokeless tobacco: Never Used  Substance Use Topics  . Alcohol use: No  . Drug use: No    Review of Systems  All other systems negative except as documented in the HPI. All pertinent positives and negatives as reviewed in the HPI. ____________________________________________   PHYSICAL EXAM:  VITAL SIGNS: ED Triage Vitals [04/09/18 1718]  Enc Vitals Group     BP 115/65     Pulse Rate (!) 105     Resp 16     Temp 98.4 F (36.9  C)     Temp Source Oral     SpO2 92 %    Constitutional: Alert and oriented. Well appearing and in no acute distress. Eyes: Conjunctivae are normal. PERRL. EOMI. Head: Atraumatic. Nose: No congestion/rhinnorhea. Mouth/Throat: Mucous membranes are moist.  Oropharynx non-erythematous. Neck: No stridor.  No meningeal signs.   Cardiovascular: tachycardic rate, regular rhythm. Good peripheral circulation. Grossly normal heart sounds.   Respiratory: Normal respiratory effort.  No retractions. Lungs CTAB. Gastrointestinal: Soft and nontender. No distention.  Musculoskeletal: No lower extremity tenderness nor edema. No gross deformities of extremities. Neurologic:  Normal speech and  language. No gross focal neurologic deficits are appreciated.  Skin:  Skin is warm, dry and intact. No rash noted.   ____________________________________________   LABS (all labs ordered are listed, but only abnormal results are displayed)  Labs Reviewed  COMPREHENSIVE METABOLIC PANEL - Abnormal; Notable for the following components:      Result Value   Sodium 134 (*)    Chloride 96 (*)    Glucose, Bld 402 (*)    BUN 22 (*)    Creatinine, Ser 1.40 (*)    Calcium 10.4 (*)    Alkaline Phosphatase 149 (*)    GFR calc non Af Amer 41 (*)    GFR calc Af Amer 47 (*)    All other components within normal limits  CBC WITH DIFFERENTIAL/PLATELET - Abnormal; Notable for the following components:   WBC 11.9 (*)    RBC 5.42 (*)    Hemoglobin 15.1 (*)    HCT 47.1 (*)    Abs Immature Granulocytes 0.10 (*)    All other components within normal limits  URINALYSIS, ROUTINE W REFLEX MICROSCOPIC - Abnormal; Notable for the following components:   Color, Urine STRAW (*)    Glucose, UA >=500 (*)    Bacteria, UA RARE (*)    All other components within normal limits  URINE CULTURE   ____________________________________________  EKG   EKG Interpretation  Date/Time:  Friday April 10 2018 00:52:58 EST Ventricular Rate:  104 PR Interval:    QRS Duration: 111 QT Interval:  362 QTC Calculation: 477 R Axis:   9 Text Interpretation:  Sinus tachycardia Abnormal R-wave progression, late transition No significant change since last tracing Confirmed by Marily Memos [phone]) on 04/10/2018 1:01:02 AM Also confirmed by Marily Memos [phone]), editor Barbette Hair [id number])  on 04/10/2018 8:07:35 AM       ____________________________________________  RADIOLOGY  Dg Chest 2 View  Result Date: 04/10/2018 CLINICAL DATA:  Hypoxia EXAM: CHEST - 2 VIEW COMPARISON:  None. FINDINGS: The heart size and mediastinal contours are within normal limits. Both lungs are clear. The visualized skeletal structures are  unremarkable. IMPRESSION: No active cardiopulmonary disease. Electronically Signed   By: Deatra Robinson M.D.   On: 04/10/2018 00:43   Ct Angio Chest Pe W And/or Wo Contrast  Result Date: 04/10/2018 CLINICAL DATA:  PE suspected, high pretest probability. Tachycardia. EXAM: CT ANGIOGRAPHY CHEST WITH CONTRAST TECHNIQUE: Multidetector CT imaging of the chest was performed using the standard protocol during bolus administration of intravenous contrast. Multiplanar CT image reconstructions and MIPs were obtained to evaluate the vascular anatomy. CONTRAST:  ISOVUE-370 IOPAMIDOL (ISOVUE-370) INJECTION 76% COMPARISON:  None. FINDINGS: Cardiovascular: --Pulmonary arteries: Contrast injection is sufficient to demonstrate satisfactory opacification of the pulmonary arteries to the segmental level. There is no pulmonary embolus. The main pulmonary artery is within normal limits for size. --Aorta: Satisfactory opacification of the thoracic aorta. No aortic  dissection or other acute aortic syndrome. Conventional 3 vessel aortic branching pattern. The aortic course and caliber are normal. There is no aortic atherosclerosis. --Heart: Normal size. No pericardial effusion. Mediastinum/Nodes: No mediastinal, hilar or axillary lymphadenopathy. The visualized thyroid and thoracic esophageal course are unremarkable. Lungs/Pleura: Mild pulmonary edema. No pleural effusion or pneumothorax. No focal airspace consolidation. Upper Abdomen: Contrast bolus timing is not optimized for evaluation of the abdominal organs. Within this limitation, the visualized organs of the upper abdomen are normal. Musculoskeletal: No chest wall abnormality. Moderate right shoulder osteoarthrosis. Review of the MIP images confirms the above findings. . IMPRESSION: 1. No pulmonary embolus or acute aortic syndrome. 2. Mild pulmonary edema. Electronically Signed   By: Deatra RobinsonKevin  Herman M.D.   On: 04/10/2018 02:46     ____________________________________________   PROCEDURES  Procedure(s) performed:   Procedures   ____________________________________________   INITIAL IMPRESSION / ASSESSMENT AND PLAN / ED COURSE  Left consistent with likely dehydration as her heart rate still at high.  However she also has mildly low oxygen and is right-sided neck pain.  Could be pyelonephritis however her urinalysis seems to be improving.  We will get a chest x-ray EKG, fluids and reevaluate.  If not improving may need to consider pulmonary embolus or infarct. Persistent tachycardia along with low o2 on initial presentation so PE study done which was negative. Improved symptoms at this point. Suspect she has/had early pyelonephritis. On appropriate antibiotics but not really long enough for things to improve at this point. Will continue abx at home, if still with dysuria/back pain or other symptoms in 48 hours, will switch to cipro. Culture sent here. Also if worsens will return here for reeval.    Pertinent labs & imaging results that were available during my care of the patient were reviewed by me and considered in my medical decision making (see chart for details).  ____________________________________________  FINAL CLINICAL IMPRESSION(S) / ED DIAGNOSES  Final diagnoses:  Acute cystitis without hematuria     MEDICATIONS GIVEN DURING THIS VISIT:  Medications  sodium chloride flush (NS) 0.9 % injection 3 mL (3 mLs Intravenous Given 04/09/18 2359)  ondansetron (ZOFRAN) injection 4 mg (4 mg Intravenous Given 04/09/18 2359)  lactated ringers bolus 1,000 mL (0 mLs Intravenous Stopped 04/10/18 0101)  iopamidol (ISOVUE-370) 76 % injection 100 mL (100 mLs Intravenous Contrast Given 04/10/18 0223)     NEW OUTPATIENT MEDICATIONS STARTED DURING THIS VISIT:  Discharge Medication List as of 04/10/2018  4:04 AM    START taking these medications   Details  ciprofloxacin (CIPRO) 500 MG tablet Take 1 tablet (500  mg total) by mouth 2 (two) times daily. If symptoms not improving by Saturday morning, Starting Fri 04/10/2018, Normal    ondansetron (ZOFRAN ODT) 8 MG disintegrating tablet Take 1 tablet (8 mg total) by mouth every 8 (eight) hours as needed for nausea or vomiting. 8mg  ODT q4 hours prn nausea, Starting Fri 04/10/2018, Normal        Note:  This note was prepared with assistance of Dragon voice recognition software. Occasional wrong-word or sound-a-like substitutions may have occurred due to the inherent limitations of voice recognition software.   Marily MemosMesner, Messiyah Waterson, MD 04/10/18 2229

## 2018-04-09 NOTE — Assessment & Plan Note (Signed)
Significant orthostatic hypotension Very low skin turgor Poor oral intake with significantly elevated blood sugars and increased glucose in the urine  Probable urinary tract involvement and need to also rule out right sided renal stone, and pyelonephritis  This patient needs at least 1 to 2 L of IV fluids at this time and likely imaging studies of the renal renal beds  The patient needs laboratory studies as well  Note the patient's been taking lisinopril on top of this volume depletion status  The patient is stable to go by private car for emergency room assessment

## 2018-04-09 NOTE — Progress Notes (Signed)
Subjective:    Patient ID: Ann Pace, female    DOB: 05-12-1955, 63 y.o.   MRN: [id number]  63 y.o. F with DM2 hx and polyneuropathy.  Here for UTI symptoms and sense of volume depletion.    This patient's symptom complex started a week ago when she noted increased urgency and frequency along with dysuria.  She was up most of the night with excess urination.  Each time she urinated it was small amounts.  She noted also increasing right flank pain.  She sought medical attention at urgent care and urine dipstick showed positive nitrates negative loose leukocytes.  Urine culture showed polymicrobial likely contaminant.  She was given Bactrim 1 twice daily double strength for 10 days and she is 3 days into the treatment.  She did not seem as though she was improving and went to urgent care again yesterday on the 15th.  They said there was little more they could add and they recommended going to primary care.  She came today for follow-up.  Note the glucose was greater than 1000 in the urine dipstick on her initial visit on the 13th.  It is still greater than 2000 today.  She has had increased vomiting and dry heaves.  She continues have burning and irritation in the bladder area and also increasing flank pain.  She is had increasing dizziness.  She has had near syncope.  The patient comes to the clinic today for further evaluation.  She notes her CBGs at home are 340.  Her recent hemoglobin A1c was greater than 10.   The patient also was in the emergency room on 23 December with flu symptoms.  Past Medical History:  Diagnosis Date  . Cataracts, bilateral   . Depression   . Diabetes mellitus without complication ([place])   . Neuropathy      Family History  Problem Relation Age of Onset  . Diabetes Mother   . Bladder Cancer Mother   . Depression Mother   . Hypertension Father   . Diabetes Sister   . Hypertension Sister   . Depression Sister   . Hypertension Sister   . Heart attack  Brother 109  . Depression Brother   . Suicidality Brother   . Skin cancer Brother   . Other Brother        unsure of history - says he does no go to the doctor  . Breast cancer Neg Hx      Social History   Socioeconomic History  . Marital status: Widowed    Spouse name: Not on file  . Number of children: 2  . Years of education: some college  . Highest education level: Not on file  Occupational History  . Occupation: Unemployed  Social Needs  . Financial resource strain: Not on file  . Food insecurity:    Worry: Not on file    Inability: Not on file  . Transportation needs:    Medical: Not on file    Non-medical: Not on file  Tobacco Use  . Smoking status: Never Smoker  . Smokeless tobacco: Never Used  Substance and Sexual Activity  . Alcohol use: No  . Drug use: No  . Sexual activity: Not on file  Lifestyle  . Physical activity:    Days per week: Not on file    Minutes per session: Not on file  . Stress: Not on file  Relationships  . Social connections:    Talks on phone: Not on file  Gets together: Not on file    Attends religious service: Not on file    Active member of club or organization: Not on file    Attends meetings of clubs or organizations: Not on file    Relationship status: Not on file  . Intimate partner violence:    Fear of current or ex partner: Not on file    Emotionally abused: Not on file    Physically abused: Not on file    Forced sexual activity: Not on file  Other Topics Concern  . Not on file  Social History Narrative   Lives with her parents.    Right-handed.   No caffeine use.     Allergies  Allergen Reactions  . Penicillins Other (See Comments)    Pt reports "pressure in her head" Has patient had a PCN reaction causing immediate rash, facial/tongue/throat swelling, SOB or lightheadedness with hypotension: No Has patient had a PCN reaction causing severe rash involving mucus membranes or skin necrosis: No Has patient had a  PCN reaction that required hospitalization: No Has patient had a PCN reaction occurring within the last 10 years: No If all of the above answers are "NO", then may proceed with Cephalosporin use.      Outpatient Medications Prior to Visit  Medication Sig Dispense Refill  . allopurinol (ZYLOPRIM) 300 MG tablet Take 1 tablet (300 mg total) by mouth daily. 30 tablet 11  . Blood Glucose Monitoring Suppl (ACCU-CHEK AVIVA PLUS) w/Device KIT 1 each by Does not apply route daily. 1 kit 0  . buPROPion (WELLBUTRIN XL) 300 MG 24 hr tablet Take 1 tablet (300 mg total) by mouth daily. 30 tablet 5  . celecoxib (CELEBREX) 200 MG capsule Take 1 capsule (200 mg total) by mouth 2 (two) times daily. (Patient taking differently: Take 200 mg by mouth daily. ) 60 capsule 2  . dapagliflozin propanediol (FARXIGA) 10 MG TABS tablet Take 10 mg by mouth daily. 30 tablet 11  . DULoxetine (CYMBALTA) 60 MG capsule Take 1 capsule (60 mg total) by mouth daily. 90 capsule 3  . gabapentin (NEURONTIN) 300 MG capsule Take 1 capsule (300 mg total) by mouth at bedtime. 30 capsule 11  . glucose blood (ACCU-CHEK AVIVA) test strip Use TID. 100 each 12  . Insulin Glargine (LANTUS SOLOSTAR) 100 UNIT/ML Solostar Pen Inject 50 units at bedtime and 50 units 12 hours after bedtime dose. (Patient taking differently: Inject 50-60 Units into the skin See admin instructions. Inject 60 units subcutaneously every morning and 50 units at night) 10 pen 11  . insulin lispro (HUMALOG KWIKPEN) 100 UNIT/ML KiwkPen If sugar 150-200 take 2 units If sugar 201-251 take 4 units If sugar 251-300 take 6 units If sugar 301-350 take 8 units If sugar 351-400 take 10 units 15 mL 11  . Insulin Pen Needle (PEN NEEDLES) 30G X 8 MM MISC Inject 1 application into the skin 5 (five) times daily. 150 each 11  . Lancets (ACCU-CHEK SOFT TOUCH) lancets Use TID. 100 each 12  . lisinopril (PRINIVIL,ZESTRIL) 10 MG tablet Take 1 tablet (10 mg total) by mouth daily. 90 tablet  3  . omega-3 acid ethyl esters (LOVAZA) 1 g capsule Take 2 capsules (2 g total) by mouth 2 (two) times daily. 360 capsule 1  . rosuvastatin (CRESTOR) 20 MG tablet Take 1 tablet (20 mg total) by mouth daily. 90 tablet 1  . sulfamethoxazole-trimethoprim (BACTRIM DS,SEPTRA DS) 800-160 MG tablet Take 1 tablet by mouth 2 (two) times daily.  20 tablet 0  . fluconazole (DIFLUCAN) 150 MG tablet Take 1 tablet (150 mg total) by mouth once a week. (Patient not taking: Reported on 04/09/2018) 2 tablet 0  . insulin aspart (NOVOLOG FLEXPEN) 100 UNIT/ML FlexPen Inject 2-10 Units into the skin See admin instructions. Inject 2-10 units subcutaneously three times daily before meals per sliding scale: CBG 150-200  2 units;  201-251  4 units; 251-300  6 units;  301-350  8 units;  351-400 take 10 units    . acetaminophen (TYLENOL) 500 MG tablet Take 2 tablets (1,000 mg total) by mouth every 8 (eight) hours as needed. (Patient taking differently: Take 1,000 mg by mouth every 8 (eight) hours as needed for mild pain or headache. ) 84 tablet 0  . meloxicam (MOBIC) 15 MG tablet Take 1 tablet (15 mg total) by mouth daily. 30 tablet 0  . naproxen (NAPROSYN) 500 MG tablet Take 1 tablet (500 mg total) by mouth 2 (two) times daily with a meal. (Patient taking differently: Take 500 mg by mouth 2 (two) times daily as needed for moderate pain. ) 40 tablet 0   No facility-administered medications prior to visit.       Review of Systems  Constitutional: Positive for activity change, chills, diaphoresis and fatigue. Negative for fever.  HENT: Negative.   Eyes: Negative.   Respiratory: Negative.   Cardiovascular: Negative.   Gastrointestinal: Negative.   Genitourinary: Positive for decreased urine volume, dysuria, flank pain, frequency and urgency. Negative for hematuria and pelvic pain.  Skin: Negative.   Neurological: Positive for dizziness, weakness and light-headedness. Negative for syncope and numbness.  Hematological: Does  not bruise/bleed easily.  Psychiatric/Behavioral: Negative.        Objective:   Physical Exam  BP 130/70 (BP Location: Right Arm, Patient Position: Sitting, Cuff Size: Normal)   Pulse (!) 105   Temp 98 F (36.7 C) (Oral)   Ht 5' 4"  (1.626 m)   Wt 184 lb 3.2 oz (83.6 kg)   SpO2 93%   BMI 31.62 kg/m  Orthostatic blood pressure was taken supine the patient was at 140 sitting she dropped to 081 systolic and standing she dropped to 448 systolic with increased heart rate  Gen: Pleasant, well-nourished, in no distress,  normal affect  ENT: No lesions,  mouth clear,  oropharynx clear, no postnasal drip  Neck: No JVD, no TMG, no carotid bruits  Lungs: No use of accessory muscles, no dullness to percussion, clear without rales or rhonchi  Cardiovascular: RRR, heart sounds normal, no murmur or gallops, no peripheral edema  Abdomen: soft right flank tenderness noted tenderness in the supra-pubic area as well, no HSM,  BS normal  Musculoskeletal: No deformities, no cyanosis or clubbing  Neuro: alert, non focal  Skin: Very poor skin turgor        Assessment & Plan:  I personally reviewed all images and lab data in the [facility] system as well as any outside material available during this office visit and agree with the  radiology impressions.   Hypotension due to hypovolemia Significant orthostatic hypotension Very low skin turgor Poor oral intake with significantly elevated blood sugars and increased glucose in the urine  Probable urinary tract involvement and need to also rule out right sided renal stone, and pyelonephritis  This patient needs at least 1 to 2 L of IV fluids at this time and likely imaging studies of the renal renal beds  The patient needs laboratory studies as well  Note the  patient's been taking lisinopril on top of this volume depletion status  The patient is stable to go by private car for emergency room assessment  Type 2 diabetes mellitus ([place]) Diabetes  mellitus poorly controlled likely exacerbated by ongoing renal infection    Essential hypertension Currently patient has volume depletion and in the face of taking antihypertensives has orthostatic hypotension   Aviance was seen today for urinary tract infection and urinary incontinence.  Diagnoses and all orders for this visit:  Hypotension due to hypovolemia  Urinary tract infection without hematuria, site unspecified -     POCT URINALYSIS DIP (CLINITEK)  Type 2 diabetes mellitus with diabetic polyneuropathy, with long-term current use of insulin (HCC)  Essential hypertension

## 2018-04-09 NOTE — ED Notes (Signed)
ED Provider at bedside. 

## 2018-04-10 ENCOUNTER — Emergency Department (HOSPITAL_COMMUNITY): Payer: Self-pay

## 2018-04-10 MED ORDER — IOPAMIDOL (ISOVUE-370) INJECTION 76%
100.0000 mL | Freq: Once | INTRAVENOUS | Status: AC | PRN
  Administered 2018-04-10: 100 mL via INTRAVENOUS

## 2018-04-10 MED ORDER — ONDANSETRON 8 MG PO TBDP: 8.0000 mg | ORAL_TABLET | Freq: Three times a day (TID) | ORAL | 0 refills | Status: DC | PRN

## 2018-04-10 MED ORDER — CIPROFLOXACIN HCL 500 MG PO TABS: 500.0000 mg | ORAL_TABLET | Freq: Two times a day (BID) | ORAL | 0 refills | Status: DC

## 2018-04-10 MED FILL — CIPROFLOXACIN HCL 500 MG TA: 500 | 7 days supply | Qty: 14 | Fill #0

## 2018-04-10 MED FILL — ONDANSETRON ODT 8 MG TABLET: 8 | 3 days supply | Qty: 10 | Fill #0

## 2018-04-10 NOTE — ED Notes (Signed)
Patient transported to X-ray 

## 2018-04-10 NOTE — ED Notes (Addendum)
Patient verbalizes understanding of discharge instructions. Opportunity for questioning and answers were provided. Armband removed by staff, pt discharged from ED ambulatory.   

## 2018-04-10 NOTE — ED Notes (Signed)
Patient transported to CT 

## 2018-04-11 ENCOUNTER — Inpatient Hospital Stay (HOSPITAL_COMMUNITY)
Admission: EM | Admit: 2018-04-11 | Discharge: 2018-04-13 | DRG: 638 | Disposition: A | Discharge status: Home / Self Care | Payer: Self-pay | Attending: Internal Medicine | Admitting: Internal Medicine

## 2018-04-11 ENCOUNTER — Encounter (HOSPITAL_COMMUNITY): Payer: Self-pay | Admitting: Emergency Medicine

## 2018-04-11 DIAGNOSIS — I129 Hypertensive chronic kidney disease with stage 1 through stage 4 chronic kidney disease, or unspecified chronic kidney disease: Secondary | ICD-10-CM | POA: Diagnosis present

## 2018-04-11 DIAGNOSIS — N183 Chronic kidney disease, stage 3 (moderate): Secondary | ICD-10-CM | POA: Diagnosis present

## 2018-04-11 DIAGNOSIS — N390 Urinary tract infection, site not specified: Secondary | ICD-10-CM | POA: Diagnosis present

## 2018-04-11 DIAGNOSIS — N179 Acute kidney failure, unspecified: Secondary | ICD-10-CM | POA: Diagnosis present

## 2018-04-11 DIAGNOSIS — E1165 Type 2 diabetes mellitus with hyperglycemia: Principal | ICD-10-CM | POA: Diagnosis present

## 2018-04-11 DIAGNOSIS — E860 Dehydration: Secondary | ICD-10-CM | POA: Diagnosis present

## 2018-04-11 DIAGNOSIS — F329 Major depressive disorder, single episode, unspecified: Secondary | ICD-10-CM | POA: Diagnosis present

## 2018-04-11 DIAGNOSIS — Z8249 Family history of ischemic heart disease and other diseases of the circulatory system: Secondary | ICD-10-CM

## 2018-04-11 DIAGNOSIS — R739 Hyperglycemia, unspecified: Secondary | ICD-10-CM

## 2018-04-11 DIAGNOSIS — G629 Polyneuropathy, unspecified: Secondary | ICD-10-CM

## 2018-04-11 DIAGNOSIS — Z794 Long term (current) use of insulin: Secondary | ICD-10-CM

## 2018-04-11 DIAGNOSIS — Z87440 Personal history of urinary (tract) infections: Secondary | ICD-10-CM

## 2018-04-11 DIAGNOSIS — Z833 Family history of diabetes mellitus: Secondary | ICD-10-CM

## 2018-04-11 DIAGNOSIS — Z818 Family history of other mental and behavioral disorders: Secondary | ICD-10-CM

## 2018-04-11 DIAGNOSIS — E875 Hyperkalemia: Secondary | ICD-10-CM | POA: Diagnosis present

## 2018-04-11 DIAGNOSIS — Z79899 Other long term (current) drug therapy: Secondary | ICD-10-CM

## 2018-04-11 DIAGNOSIS — N3000 Acute cystitis without hematuria: Secondary | ICD-10-CM | POA: Diagnosis present

## 2018-04-11 DIAGNOSIS — E1122 Type 2 diabetes mellitus with diabetic chronic kidney disease: Secondary | ICD-10-CM | POA: Diagnosis present

## 2018-04-11 DIAGNOSIS — E785 Hyperlipidemia, unspecified: Secondary | ICD-10-CM | POA: Diagnosis present

## 2018-04-11 DIAGNOSIS — I10 Essential (primary) hypertension: Secondary | ICD-10-CM | POA: Diagnosis present

## 2018-04-11 DIAGNOSIS — E1169 Type 2 diabetes mellitus with other specified complication: Secondary | ICD-10-CM | POA: Diagnosis present

## 2018-04-11 DIAGNOSIS — Z888 Allergy status to other drugs, medicaments and biological substances status: Secondary | ICD-10-CM

## 2018-04-11 DIAGNOSIS — E1142 Type 2 diabetes mellitus with diabetic polyneuropathy: Secondary | ICD-10-CM

## 2018-04-11 LAB — I-STAT CHEM 8, ED
BUN: 38 mg/dL — ABNORMAL HIGH (ref 6–20)
BUN: 41 mg/dL — ABNORMAL HIGH (ref 6–20)
Calcium, Ion: 1.11 mmol/L — ABNORMAL LOW (ref 1.15–1.40)
Calcium, Ion: 1.15 mmol/L (ref 1.15–1.40)
Chloride: 98 mmol/L (ref 98–111)
Chloride: 99 mmol/L (ref 98–111)
Creatinine, Ser: 1.4 mg/dL — ABNORMAL HIGH (ref 0.44–1.00)
Creatinine, Ser: 1.5 mg/dL — ABNORMAL HIGH (ref 0.44–1.00)
Glucose, Bld: >700 mg/dL (ref 70–99)
HCT: 43 % (ref 36.0–46.0)
HCT: 44 % (ref 36.0–46.0)
Hemoglobin: 14.6 g/dL (ref 12.0–15.0)
Hemoglobin: 15 g/dL (ref 12.0–15.0)
Potassium: 6.5 mmol/L (ref 3.5–5.1)
Potassium: 6.6 mmol/L (ref 3.5–5.1)
SODIUM: 129 mmol/L — AB (ref 135–145)
SODIUM: 129 mmol/L — AB (ref 135–145)
TCO2: 23 mmol/L (ref 22–32)
TCO2: 25 mmol/L (ref 22–32)

## 2018-04-11 LAB — BASIC METABOLIC PANEL
Anion gap: 14 (ref 5–15)
BUN: 27 mg/dL — ABNORMAL HIGH (ref 6–20)
CO2: 20 mmol/L — ABNORMAL LOW (ref 22–32)
Calcium: 9 mg/dL (ref 8.9–10.3)
Chloride: 91 mmol/L — ABNORMAL LOW (ref 98–111)
Creatinine, Ser: 1.68 mg/dL — ABNORMAL HIGH (ref 0.44–1.00)
GFR, EST AFRICAN AMERICAN: 38 mL/min — AB (ref >60)
GFR, EST NON AFRICAN AMERICAN: 33 mL/min — AB (ref >60)
Glucose, Bld: 776 mg/dL (ref 70–99)
Potassium: 6.2 mmol/L — ABNORMAL HIGH (ref 3.5–5.1)
Sodium: 125 mmol/L — ABNORMAL LOW (ref 135–145)

## 2018-04-11 LAB — CBC WITH DIFFERENTIAL/PLATELET
Abs Immature Granulocytes: 0.06 10*3/uL (ref 0.00–0.07)
BASOS PCT: 0 %
Basophils Absolute: 0 10*3/uL (ref 0.0–0.1)
Eosinophils Absolute: 0.1 10*3/uL (ref 0.0–0.5)
Eosinophils Relative: 1 %
HCT: 44.4 % (ref 36.0–46.0)
Hemoglobin: 13.8 g/dL (ref 12.0–15.0)
Immature Granulocytes: 1 %
Lymphocytes Relative: 20 %
Lymphs Abs: 1.8 10*3/uL (ref 0.7–4.0)
MCH: 28 pg (ref 26.0–34.0)
MCHC: 31.1 g/dL (ref 30.0–36.0)
MCV: 90.1 fL (ref 80.0–100.0)
Monocytes Absolute: 0.4 10*3/uL (ref 0.1–1.0)
Monocytes Relative: 5 %
NRBC: 0 % (ref 0.0–0.2)
Neutro Abs: 6.5 10*3/uL (ref 1.7–7.7)
Neutrophils Relative %: 73 %
PLATELETS: 267 10*3/uL (ref 150–400)
RBC: 4.93 MIL/uL (ref 3.87–5.11)
RDW: 13 % (ref 11.5–15.5)
WBC: 8.9 10*3/uL (ref 4.0–10.5)

## 2018-04-11 LAB — URINE CULTURE

## 2018-04-11 LAB — CBG MONITORING, ED
Glucose-Capillary: >600 mg/dL (ref 70–99)
Glucose-Capillary: 547 mg/dL (ref 70–99)
Glucose-Capillary: 406 mg/dL — ABNORMAL HIGH (ref 70–99)

## 2018-04-11 LAB — I-STAT BETA HCG BLOOD, ED (MC, WL, AP ONLY)

## 2018-04-11 LAB — I-STAT TROPONIN, ED: Troponin i, poc: 0.01 ng/mL (ref 0.00–0.08)

## 2018-04-11 MED ORDER — INSULIN REGULAR(HUMAN) IN NACL 100-0.9 UT/100ML-% IV SOLN
INTRAVENOUS | Status: DC
  Administered 2018-04-11: 5.4 [IU]/h via INTRAVENOUS
  Filled 2018-04-11: qty 100

## 2018-04-11 MED ORDER — BUPROPION HCL ER (XL) 150 MG PO TB24
300.0000 mg | ORAL_TABLET | Freq: Every day | ORAL | Status: DC
  Administered 2018-04-12 – 2018-04-13 (×2): 300 mg via ORAL
  Filled 2018-04-11 (×2): qty 2

## 2018-04-11 MED ORDER — CANAGLIFLOZIN 100 MG PO TABS: 100.0000 mg | ORAL_TABLET | Freq: Every day | ORAL | Status: DC

## 2018-04-11 MED ORDER — FLUCONAZOLE 150 MG PO TABS: 150.0000 mg | ORAL_TABLET | ORAL | Status: DC

## 2018-04-11 MED ORDER — OMEGA-3-ACID ETHYL ESTERS 1 G PO CAPS
2.0000 g | ORAL_CAPSULE | Freq: Two times a day (BID) | ORAL | Status: DC
  Administered 2018-04-12 (×2): 2 g via ORAL
  Filled 2018-04-11 (×3): qty 2

## 2018-04-11 MED ORDER — CELECOXIB 200 MG PO CAPS
200.0000 mg | ORAL_CAPSULE | Freq: Every day | ORAL | Status: DC
  Administered 2018-04-12 – 2018-04-13 (×2): 200 mg via ORAL
  Filled 2018-04-11 (×2): qty 1

## 2018-04-11 MED ORDER — DEXTROSE 50 % IV SOLN: 25.0000 mL | INTRAVENOUS | Status: DC | PRN

## 2018-04-11 MED ORDER — ALLOPURINOL 300 MG PO TABS
300.0000 mg | ORAL_TABLET | Freq: Every day | ORAL | Status: DC
  Administered 2018-04-12 – 2018-04-13 (×2): 300 mg via ORAL
  Filled 2018-04-11 (×2): qty 1

## 2018-04-11 MED ORDER — GABAPENTIN 300 MG PO CAPS
300.0000 mg | ORAL_CAPSULE | Freq: Every day | ORAL | Status: DC
  Administered 2018-04-12 (×2): 300 mg via ORAL
  Filled 2018-04-11 (×2): qty 1

## 2018-04-11 MED ORDER — SODIUM CHLORIDE 0.9 % IV SOLN: INTRAVENOUS | Status: DC

## 2018-04-11 MED ORDER — ONDANSETRON HCL 4 MG PO TABS: 4.0000 mg | ORAL_TABLET | Freq: Four times a day (QID) | ORAL | Status: DC | PRN

## 2018-04-11 MED ORDER — ONDANSETRON HCL 4 MG/2ML IJ SOLN: 4.0000 mg | Freq: Four times a day (QID) | INTRAMUSCULAR | Status: DC | PRN

## 2018-04-11 MED ORDER — HEPARIN SODIUM (PORCINE) 5000 UNIT/ML IJ SOLN
5000.0000 [IU] | Freq: Three times a day (TID) | INTRAMUSCULAR | Status: DC
  Administered 2018-04-12 – 2018-04-13 (×4): 5000 [IU] via SUBCUTANEOUS
  Filled 2018-04-11 (×4): qty 1

## 2018-04-11 MED ORDER — SODIUM CHLORIDE 0.9 % IV SOLN
INTRAVENOUS | Status: DC
  Administered 2018-04-12: via INTRAVENOUS

## 2018-04-11 MED ORDER — ONDANSETRON 4 MG PO TBDP: 8.0000 mg | ORAL_TABLET | Freq: Three times a day (TID) | ORAL | Status: DC | PRN

## 2018-04-11 MED ORDER — NAPROXEN 250 MG PO TABS
500.0000 mg | ORAL_TABLET | Freq: Two times a day (BID) | ORAL | Status: DC | PRN
  Administered 2018-04-12: 500 mg via ORAL
  Filled 2018-04-11: qty 2

## 2018-04-11 MED ORDER — ROSUVASTATIN CALCIUM 20 MG PO TABS
20.0000 mg | ORAL_TABLET | Freq: Every day | ORAL | Status: DC
  Administered 2018-04-12 – 2018-04-13 (×2): 20 mg via ORAL
  Filled 2018-04-11 (×2): qty 1

## 2018-04-11 MED ORDER — DULOXETINE HCL 60 MG PO CPEP
60.0000 mg | ORAL_CAPSULE | Freq: Every day | ORAL | Status: DC
  Administered 2018-04-12 – 2018-04-13 (×2): 60 mg via ORAL
  Filled 2018-04-11 (×2): qty 1

## 2018-04-11 MED ORDER — INSULIN REGULAR(HUMAN) IN NACL 100-0.9 UT/100ML-% IV SOLN
INTRAVENOUS | Status: DC
  Administered 2018-04-12: 4.9 [IU]/h via INTRAVENOUS
  Administered 2018-04-12: 12.4 [IU]/h via INTRAVENOUS
  Filled 2018-04-11: qty 100

## 2018-04-11 MED ORDER — INSULIN REGULAR BOLUS VIA INFUSION
0.0000 [IU] | Freq: Three times a day (TID) | INTRAVENOUS | Status: DC
  Administered 2018-04-12: 2.8 [IU] via INTRAVENOUS
  Filled 2018-04-11: qty 10

## 2018-04-11 MED ORDER — DEXTROSE-NACL 5-0.45 % IV SOLN
INTRAVENOUS | Status: DC
  Administered 2018-04-12: 01:00:00 via INTRAVENOUS

## 2018-04-11 NOTE — Progress Notes (Signed)
PHARMACIST - PHYSICIAN ORDER COMMUNICATION  CONCERNING: Medication Policy on SGLT-2 Inhibitors   DESCRIPTION:  This patient's order for Invokana has been noted.  "Per medication safety policy, oral diabetic SGLT-2 inhibitor therapy has been interrupted during hospitalization. May reinitiate on discharge if appropriate."  Abran Duke, PharmD, BCPS Clinical Pharmacist Phone: [phone]

## 2018-04-11 NOTE — H&P (Signed)
History and Physical   Ann Pace XYI:[id number] DOB: 1955-06-28 DOA: 04/11/2018  Referring MD/NP/PA: Dr. Tamera Punt  PCP: Elsie Stain, MD    Patient coming from: Home  Chief Complaint: Weakness and elevated glucose  HPI: Ann Pace is a 63 y.o. female with medical history significant of uncontrolled diabetes, recent UTI, depression, peripheral neuropathy, who has been in and out of the ER for few days in the last week.  She has recent UTI and was treated using Bactrim.  Also Cipro weekly.  Patient has had problem with her blood sugar and came in today with blood sugar more than 700.  She is not acidotic.  Patient is feeling weak and debilitated.  She was also found to have potassium of 6.5.  No other worsening renal function.  Patient is visibly dehydrated.  Denied any nausea vomiting but she has decreased appetite.  She tried her meter at home was reading high unmeasurable so she came to the ER to be evaluated.  Patient has had some dysuria.  Patient has been initiated on IV insulin and is being admitted for treatment.  ED Course: Temp is 98 6 blood pressure 160/84 pulse 150 respirate of 29 oxygen sat 94% room air.  She has a white count of 8.9 hemoglobin 14.6 platelet 267 sodium is 129 potassium 3.5 CO2 20 BUN 38 creatinine 1.50 and glucose 799.  Patient has received fluid boluses and is being admitted to the hospital on IV insulin  Review of Systems: As per HPI otherwise 10 point review of systems negative.    Past Medical History:  Diagnosis Date  . Cataracts, bilateral   . Depression   . Diabetes mellitus without complication ([place])   . Neuropathy     Past Surgical History:  Procedure Laterality Date  . ABDOMINAL HYSTERECTOMY    . CARPAL TUNNEL RELEASE Right   . KNEE SURGERY Bilateral      reports that she has never smoked. She has never used smokeless tobacco. She reports that she does not drink alcohol or use drugs.  Allergies  Allergen Reactions  . Cortisone Other  (See Comments)    Cortisone shots make BGL go too high  . Penicillins Other (See Comments)    Pt reports "pressure in her head"- "a bad headache" Has patient had a PCN reaction causing immediate rash, facial/tongue/throat swelling, SOB or lightheadedness with hypotension: No Has patient had a PCN reaction causing severe rash involving mucus membranes or skin necrosis: No Has patient had a PCN reaction that required hospitalization: No Has patient had a PCN reaction occurring within the last 10 years: No If all of the above answers are "NO", then may proceed with Cephalosporin use.     Family History  Problem Relation Age of Onset  . Diabetes Mother   . Bladder Cancer Mother   . Depression Mother   . Hypertension Father   . Diabetes Sister   . Hypertension Sister   . Depression Sister   . Hypertension Sister   . Heart attack Brother 64  . Depression Brother   . Suicidality Brother   . Skin cancer Brother   . Other Brother        unsure of history - says he does no go to the doctor  . Breast cancer Neg Hx      Prior to Admission medications   Medication Sig Start Date End Date Taking? Authorizing Provider  allopurinol (ZYLOPRIM) 300 MG tablet Take 1 tablet (300 mg total) by mouth  daily. 05/15/17   Clent Demark, PA-C  Blood Glucose Monitoring Suppl (ACCU-CHEK AVIVA PLUS) w/Device KIT 1 each by Does not apply route daily. 10/07/17   Clent Demark, PA-C  buPROPion (WELLBUTRIN XL) 300 MG 24 hr tablet Take 1 tablet (300 mg total) by mouth daily. 05/15/17   Clent Demark, PA-C  celecoxib (CELEBREX) 200 MG capsule Take 1 capsule (200 mg total) by mouth 2 (two) times daily. Patient taking differently: Take 200 mg by mouth daily.  06/12/17   Clent Demark, PA-C  ciprofloxacin (CIPRO) 500 MG tablet Take 1 tablet (500 mg total) by mouth 2 (two) times daily. If symptoms not improving by Saturday morning 04/10/18   Mesner, Corene Cornea, MD  dapagliflozin propanediol (FARXIGA) 10 MG  TABS tablet Take 10 mg by mouth daily. 05/15/17   Clent Demark, PA-C  DULoxetine (CYMBALTA) 60 MG capsule Take 1 capsule (60 mg total) by mouth daily. 05/15/17   Clent Demark, PA-C  fluconazole (DIFLUCAN) 150 MG tablet Take 1 tablet (150 mg total) by mouth once a week. 04/06/18   Jaynee Eagles, PA-C  gabapentin (NEURONTIN) 300 MG capsule Take 1 capsule (300 mg total) by mouth at bedtime. 10/07/17   Clent Demark, PA-C  glucose blood (ACCU-CHEK AVIVA) test strip Use TID. 10/07/17   Clent Demark, PA-C  insulin aspart (NOVOLOG FLEXPEN) 100 UNIT/ML FlexPen Inject 2-10 Units into the skin See admin instructions. Inject 2-10 units subcutaneously three times daily before meals per sliding scale: CBG 150-200 = 2 units;  201-251 =  4 units; 251-300 = 6 units;  301-350 = 8 units;  351-400 = 10 units    [provider]  Insulin Glargine (LANTUS SOLOSTAR) 100 UNIT/ML Solostar Pen Inject 50 units at bedtime and 50 units 12 hours after bedtime dose. Patient taking differently: Inject 50-60 Units into the skin See admin instructions. Inject 60 units subcutaneously every morning after breakfast and 50 units at night (bedtime) 10/07/17   Clent Demark, PA-C  Insulin Pen Needle (PEN NEEDLES) 30G X 8 MM MISC Inject 1 application into the skin 5 (five) times daily. 06/12/17   Clent Demark, PA-C  Lancets [facility]) lancets Use TID. 10/07/17   Clent Demark, PA-C  lisinopril (PRINIVIL,ZESTRIL) 10 MG tablet Take 1 tablet (10 mg total) by mouth daily. 05/15/17   Clent Demark, PA-C  naproxen (NAPROSYN) 500 MG tablet Take 500 mg by mouth 2 (two) times daily as needed (for nerve pain).    [provider]  omega-3 acid ethyl esters (LOVAZA) 1 g capsule Take 2 capsules (2 g total) by mouth 2 (two) times daily. 11/27/17   Clent Demark, PA-C  ondansetron (ZOFRAN ODT) 8 MG disintegrating tablet Take 1 tablet (8 mg total) by mouth every 8 (eight) hours as needed for  nausea or vomiting. 58m ODT q4 hours prn nausea 04/10/18   Mesner, JCorene Cornea MD  rosuvastatin (CRESTOR) 20 MG tablet Take 1 tablet (20 mg total) by mouth daily. 11/27/17   GClent Demark PA-C  sulfamethoxazole-trimethoprim (BACTRIM DS,SEPTRA DS) 800-160 MG tablet Take 1 tablet by mouth 2 (two) times daily. Patient taking differently: Take 1 tablet by mouth 2 (two) times daily. FOR 10 DAYS 04/06/18   MJaynee Eagles PA-C    Physical Exam: Vitals:   04/11/18 1914 04/11/18 1915 04/11/18 2000 04/11/18 2030  BP:  128/66 129/65 126/63  Pulse:  (!) 109 (!) 105 (!) 102  Resp:  17 (!) 23 (Marland Kitchen  23  Temp:      TempSrc:      SpO2:  97% 94% 97%  Weight: 83 kg     Height: 5' 4"  (1.626 m)         Constitutional: NAD, calm, comfortable Vitals:   04/11/18 1914 04/11/18 1915 04/11/18 2000 04/11/18 2030  BP:  128/66 129/65 126/63  Pulse:  (!) 109 (!) 105 (!) 102  Resp:  17 (!) 23 (!) 23  Temp:      TempSrc:      SpO2:  97% 94% 97%  Weight: 83 kg     Height: 5' 4"  (1.626 m)      Eyes: PERRL, lids and conjunctivae normal ENMT: Mucous membranes are dry. Posterior pharynx clear of any exudate or lesions.Normal dentition.  Neck: normal, supple, no masses, no thyromegaly Respiratory: clear to auscultation bilaterally, no wheezing, no crackles. Normal respiratory effort. No accessory muscle use.  Cardiovascular: Regular rate and rhythm, no murmurs / rubs / gallops. No extremity edema. 2+ pedal pulses. No carotid bruits.  Abdomen: no tenderness, no masses palpated. No hepatosplenomegaly. Bowel sounds positive.  Musculoskeletal: no clubbing / cyanosis. No joint deformity upper and lower extremities. Good ROM, no contractures. Normal muscle tone.  Skin: no rashes, lesions, ulcers. No induration Neurologic: CN 2-12 grossly intact. Sensation intact, DTR normal. Strength 5/5 in all 4.  Psychiatric: Normal judgment and insight. Alert and oriented x 3. Normal mood.     Labs on Admission: I have personally  reviewed following labs and imaging studies  CBC: Recent Labs  Lab 04/09/18 2018 04/11/18 1847 04/11/18 2050 04/11/18 2054  WBC 11.9* 8.9  --   --   NEUTROABS 7.6 6.5  --   --   HGB 15.1* 13.8 15.0 14.6  HCT 47.1* 44.4 44.0 43.0  MCV 86.9 90.1  --   --   PLT 352 267  --   --    Basic Metabolic Panel: Recent Labs  Lab 04/09/18 2018 04/11/18 1847 04/11/18 2050 04/11/18 2054  NA 134* 125* 129* 129*  K 4.1 6.2* 6.6* 6.5*  CL 96* 91* 99 98  CO2 25 20*  --   --   GLUCOSE 402* 776* >700* >700*  BUN 22* 27* 41* 38*  CREATININE 1.40* 1.68* 1.40* 1.50*  CALCIUM 10.4* 9.0  --   --    GFR: Estimated Creatinine Clearance: 41.6 mL/min (A) (by C-G formula based on SCr of 1.5 mg/dL (H)). Liver Function Tests: Recent Labs  Lab 04/09/18 2018  AST 15  ALT 19  ALKPHOS 149*  BILITOT 0.4  PROT 6.9  ALBUMIN 3.8   No results for input(s): LIPASE, AMYLASE in the last 168 hours. No results for input(s): AMMONIA in the last 168 hours. Coagulation Profile: No results for input(s): INR, PROTIME in the last 168 hours. Cardiac Enzymes: No results for input(s): CKTOTAL, CKMB, CKMBINDEX, TROPONINI in the last 168 hours. BNP (last 3 results) No results for input(s): PROBNP in the last 8760 hours. HbA1C: No results for input(s): HGBA1C in the last 72 hours. CBG: Recent Labs  Lab 04/11/18 1920 04/11/18 2121 04/11/18 2218  GLUCAP >600* >600* 547*   Lipid Profile: No results for input(s): CHOL, HDL, LDLCALC, TRIG, CHOLHDL, LDLDIRECT in the last 72 hours. Thyroid Function Tests: No results for input(s): TSH, T4TOTAL, FREET4, T3FREE, THYROIDAB in the last 72 hours. Anemia Panel: No results for input(s): VITAMINB12, FOLATE, FERRITIN, TIBC, IRON, RETICCTPCT in the last 72 hours. Urine analysis:    Component Value  Date/Time   COLORURINE STRAW (A) 04/09/2018 1716   APPEARANCEUR CLEAR 04/09/2018 1716   LABSPEC 1.030 04/09/2018 1716   PHURINE 5.0 04/09/2018 1716   GLUCOSEU >=500 (A)  04/09/2018 1716   HGBUR NEGATIVE 04/09/2018 [place] 04/09/2018 1716   BILIRUBINUR negative 04/09/2018 [place] 04/09/2018 1716   PROTEINUR NEGATIVE 04/09/2018 1716   UROBILINOGEN 0.2 04/09/2018 1617   UROBILINOGEN 1.0 04/06/2018 1715   NITRITE NEGATIVE 04/09/2018 [place] 04/09/2018 1716   Sepsis Labs: @LABRCNTIP (procalcitonin:4,lacticidven:4) ) Recent Results (from the past 240 hour(s))  Urine culture     Status: None   Collection Time: 04/06/18  5:16 PM  Result Value Ref Range Status   Specimen Description URINE, RANDOM  Final   Special Requests   Final    NONE Performed at [facility], [place] [address], [place], [place] [address]    Culture   Final    Multiple bacterial morphotypes present, none predominant. Suggest appropriate recollection if clinically indicated.   Report Status 04/08/2018 FINAL  Final  Urine Culture     Status: Abnormal   Collection Time: 04/09/18  9:10 AM  Result Value Ref Range Status   Specimen Description URINE, RANDOM  Final   Special Requests   Final    NONE Performed at [facility], [address] [place], [place] [address]    Culture MULTIPLE SPECIES PRESENT, SUGGEST RECOLLECTION (A)  Final   Report Status 04/11/2018 FINAL  Final     Radiological Exams on Admission: Dg Chest 2 View  Result Date: 04/10/2018 CLINICAL DATA:  Hypoxia EXAM: CHEST - 2 VIEW COMPARISON:  None. FINDINGS: The heart size and mediastinal contours are within normal limits. Both lungs are clear. The visualized skeletal structures are unremarkable. IMPRESSION: No active cardiopulmonary disease. Electronically Signed   By: Ulyses Jarred M.D.   On: 04/10/2018 00:43   Ct Angio Chest Pe W And/or Wo Contrast  Result Date: 04/10/2018 CLINICAL DATA:  PE suspected, high pretest probability. Tachycardia. EXAM: CT ANGIOGRAPHY CHEST WITH CONTRAST TECHNIQUE: Multidetector CT imaging of the chest was performed using  the standard protocol during bolus administration of intravenous contrast. Multiplanar CT image reconstructions and MIPs were obtained to evaluate the vascular anatomy. CONTRAST:  116m ISOVUE-370 IOPAMIDOL (ISOVUE-370) INJECTION 76% COMPARISON:  None. FINDINGS: Cardiovascular: --Pulmonary arteries: Contrast injection is sufficient to demonstrate satisfactory opacification of the pulmonary arteries to the segmental level. There is no pulmonary embolus. The main pulmonary artery is within normal limits for size. --Aorta: Satisfactory opacification of the thoracic aorta. No aortic dissection or other acute aortic syndrome. Conventional 3 vessel aortic branching pattern. The aortic course and caliber are normal. There is no aortic atherosclerosis. --Heart: Normal size. No pericardial effusion. Mediastinum/Nodes: No mediastinal, hilar or axillary lymphadenopathy. The visualized thyroid and thoracic esophageal course are unremarkable. Lungs/Pleura: Mild pulmonary edema. No pleural effusion or pneumothorax. No focal airspace consolidation. Upper Abdomen: Contrast bolus timing is not optimized for evaluation of the abdominal organs. Within this limitation, the visualized organs of the upper abdomen are normal. Musculoskeletal: No chest wall abnormality. Moderate right shoulder osteoarthrosis. Review of the MIP images confirms the above findings. . IMPRESSION: 1. No pulmonary embolus or acute aortic syndrome. 2. Mild pulmonary edema. Electronically Signed   By: KUlyses JarredM.D.   On: 04/10/2018 02:46    EKG: Independently reviewed.  Shows sinus tachycardia with a rate of 111.  Poor R wave progression but no peak  T waves  Assessment/Plan Principal Problem:   Hyperglycemia Active Problems:   Peripheral polyneuropathy   Dyslipidemia associated with type 2 diabetes mellitus ([place])   Essential hypertension   Hyperkalemia     #1 hyperglycemia: Hyperosmolar nonketotic.  Patient will be admitted to progressive care  unit.  Continue IV insulin with titration.  She is not having any DKA.  We will continue with fluids.  Once blood sugar is less than 200 we will transition to subcu insulin and titrate.  Dietary counseling.  #2 hyperkalemia: Potassium is elevated probably secondary to hyperglycemia and dehydration.  With IV insulin patient is likely to get potassium down.  No EKG changes.  #3 hypertension: Continue with home regimen and monitor closely.  #4 recent UTI: Patient is completing antibiotics.  Will maintain her on current regimen.  #5 peripheral neuropathy: Stable.  DVT prophylaxis: Heparin Code Status: Full code Family Communication: No family bedside Disposition Plan: Home Consults called: None Admission status: Inpatient  Severity of Illness: The appropriate patient status for this patient is INPATIENT. Inpatient status is judged to be reasonable and necessary in order to provide the required intensity of service to ensure the patient's safety. The patient's presenting symptoms, physical exam findings, and initial radiographic and laboratory data in the context of their chronic comorbidities is felt to place them at high risk for further clinical deterioration. Furthermore, it is not anticipated that the patient will be medically stable for discharge from the hospital within 2 midnights of admission. The following factors support the patient status of inpatient.   " The patient's presenting symptoms include weakness and high blood sugar. " The worrisome physical exam findings include dry mucous membranes. " The initial radiographic and laboratory data are worrisome because of elevated potassium and blood sugar. " The chronic co-morbidities include uncontrolled diabetes.   * I certify that at the point of admission it is my clinical judgment that the patient will require inpatient hospital care spanning beyond 2 midnights from the point of admission due to high intensity of service, high risk  for further deterioration and high frequency of surveillance required.Barbette Merino MD [facility] Pager [phone]  If 7PM-7AM, please contact night-coverage [email or web address] Password [facility]  04/11/2018, 10:43 PM

## 2018-04-11 NOTE — ED Notes (Signed)
I Stat Chem 8 result of K 6.6 with Glu > 700, sample recollected. I Stat Chem repeat with K 6.5 and a Glu of >700. Result reported to Dr. Fredderick PhenixBelfi

## 2018-04-11 NOTE — ED Provider Notes (Signed)
[facility] EMERGENCY DEPARTMENT Provider Note   CSN: [id number] Arrival date & time: 04/11/18  1840     History   Chief Complaint No chief complaint on file.   HPI Ann Pace is a 63 y.o. female.  Who presents emergency department chief complaint of high blood sugar.  This is the patient's 2nd visit to the emergency department.  She was diagnosed with acute cystitis on the 13th.  She has been taking a antibiotic.  She saw Dr. Asencion Noble on January 16 and was noted to be markedly dehydrated likely secondary to diuresis from her elevated blood glucose.  Patient had persistently elevated heart rate.  She was given fluids and ultimately discharged.  Patient states that her blood sugar monitor at home is still registering "high".  She admits to polyuria however feels that her dysuria and suprapubic discomfort are resolving. She denies fevers, chills, or flank pain.  HPI  Past Medical History:  Diagnosis Date  . Cataracts, bilateral   . Depression   . Diabetes mellitus without complication ([place])   . Neuropathy     Patient Active Problem List   Diagnosis Date Noted  . Hypotension due to hypovolemia 04/09/2018  . Urinary tract infection without hematuria 04/09/2018  . Peripheral polyneuropathy 03/12/2018  . Type 2 diabetes mellitus ([place]) 05/15/2017  . Dyslipidemia associated with type 2 diabetes mellitus ([place]) 12/12/2015  . Essential hypertension 12/12/2015    Past Surgical History:  Procedure Laterality Date  . ABDOMINAL HYSTERECTOMY    . CARPAL TUNNEL RELEASE Right   . KNEE SURGERY Bilateral      OB History    Gravida  2   Para      Term      Preterm      AB      Living  2     SAB      TAB      Ectopic      Multiple      Live Births  2            Home Medications    Prior to Admission medications   Medication Sig Start Date End Date Taking? Authorizing Provider  allopurinol (ZYLOPRIM) 300 MG tablet Take 1 tablet (300 mg  total) by mouth daily. 05/15/17   Clent Demark, PA-C  Blood Glucose Monitoring Suppl (ACCU-CHEK AVIVA PLUS) w/Device KIT 1 each by Does not apply route daily. 10/07/17   Clent Demark, PA-C  buPROPion (WELLBUTRIN XL) 300 MG 24 hr tablet Take 1 tablet (300 mg total) by mouth daily. 05/15/17   Clent Demark, PA-C  celecoxib (CELEBREX) 200 MG capsule Take 1 capsule (200 mg total) by mouth 2 (two) times daily. Patient taking differently: Take 200 mg by mouth daily.  06/12/17   Clent Demark, PA-C  ciprofloxacin (CIPRO) 500 MG tablet Take 1 tablet (500 mg total) by mouth 2 (two) times daily. If symptoms not improving by Saturday morning 04/10/18   Mesner, Corene Cornea, MD  dapagliflozin propanediol (FARXIGA) 10 MG TABS tablet Take 10 mg by mouth daily. 05/15/17   Clent Demark, PA-C  DULoxetine (CYMBALTA) 60 MG capsule Take 1 capsule (60 mg total) by mouth daily. 05/15/17   Clent Demark, PA-C  fluconazole (DIFLUCAN) 150 MG tablet Take 1 tablet (150 mg total) by mouth once a week. 04/06/18   Jaynee Eagles, PA-C  gabapentin (NEURONTIN) 300 MG capsule Take 1 capsule (300 mg total) by mouth at bedtime. 10/07/17  Clent Demark, PA-C  glucose blood (ACCU-CHEK AVIVA) test strip Use TID. 10/07/17   Clent Demark, PA-C  insulin aspart (NOVOLOG FLEXPEN) 100 UNIT/ML FlexPen Inject 2-10 Units into the skin See admin instructions. Inject 2-10 units subcutaneously three times daily before meals per sliding scale: CBG 150-200 = 2 units;  201-251 =  4 units; 251-300 = 6 units;  301-350 = 8 units;  351-400 = 10 units    [provider]  Insulin Glargine (LANTUS SOLOSTAR) 100 UNIT/ML Solostar Pen Inject 50 units at bedtime and 50 units 12 hours after bedtime dose. Patient taking differently: Inject 50-60 Units into the skin See admin instructions. Inject 60 units subcutaneously every morning after breakfast and 50 units at night (bedtime) 10/07/17   Clent Demark, PA-C  Insulin Pen  Needle (PEN NEEDLES) 30G X 8 MM MISC Inject 1 application into the skin 5 (five) times daily. 06/12/17   Clent Demark, PA-C  Lancets [facility]) lancets Use TID. 10/07/17   Clent Demark, PA-C  lisinopril (PRINIVIL,ZESTRIL) 10 MG tablet Take 1 tablet (10 mg total) by mouth daily. 05/15/17   Clent Demark, PA-C  naproxen (NAPROSYN) 500 MG tablet Take 500 mg by mouth 2 (two) times daily as needed (for nerve pain).    [provider]  omega-3 acid ethyl esters (LOVAZA) 1 g capsule Take 2 capsules (2 g total) by mouth 2 (two) times daily. 11/27/17   Clent Demark, PA-C  ondansetron (ZOFRAN ODT) 8 MG disintegrating tablet Take 1 tablet (8 mg total) by mouth every 8 (eight) hours as needed for nausea or vomiting. 11m ODT q4 hours prn nausea 04/10/18   Mesner, JCorene Cornea MD  rosuvastatin (CRESTOR) 20 MG tablet Take 1 tablet (20 mg total) by mouth daily. 11/27/17   GClent Demark PA-C  sulfamethoxazole-trimethoprim (BACTRIM DS,SEPTRA DS) 800-160 MG tablet Take 1 tablet by mouth 2 (two) times daily. Patient taking differently: Take 1 tablet by mouth 2 (two) times daily. FOR 10 DAYS 04/06/18   MJaynee Eagles PA-C    Family History Family History  Problem Relation Age of Onset  . Diabetes Mother   . Bladder Cancer Mother   . Depression Mother   . Hypertension Father   . Diabetes Sister   . Hypertension Sister   . Depression Sister   . Hypertension Sister   . Heart attack Brother 4106 . Depression Brother   . Suicidality Brother   . Skin cancer Brother   . Other Brother        unsure of history - says he does no go to the doctor  . Breast cancer Neg Hx     Social History Social History   Tobacco Use  . Smoking status: Never Smoker  . Smokeless tobacco: Never Used  Substance Use Topics  . Alcohol use: No  . Drug use: No     Allergies   Cortisone and Penicillins   Review of Systems Review of Systems Ten systems reviewed and are negative for acute  change, except as noted in the HPI.   Physical Exam Updated Vital Signs BP (!) 162/84 (BP Location: Right Arm)   Pulse (!) 115   Temp 97.9 F (36.6 C) (Oral)   Resp 16   SpO2 100%   Physical Exam Vitals signs and nursing note reviewed.  Constitutional:      General: She is not in acute distress.    Appearance: She is well-developed. She is not diaphoretic.  HENT:     Head: Normocephalic and atraumatic.  Eyes:     General: No scleral icterus.    Conjunctiva/sclera: Conjunctivae normal.  Neck:     Musculoskeletal: Normal range of motion.  Cardiovascular:     Rate and Rhythm: Normal rate and regular rhythm.     Heart sounds: Normal heart sounds. No murmur. No friction rub. No gallop.   Pulmonary:     Effort: Pulmonary effort is normal. No respiratory distress.     Breath sounds: Normal breath sounds.  Abdominal:     General: Bowel sounds are normal. There is no distension.     Palpations: Abdomen is soft. There is no mass.     Tenderness: There is no abdominal tenderness. There is no guarding.  Skin:    General: Skin is warm and dry.  Neurological:     Mental Status: She is alert and oriented to person, place, and time.  Psychiatric:        Behavior: Behavior normal.      ED Treatments / Results  Labs (all labs ordered are listed, but only abnormal results are displayed) Labs Reviewed  CBC WITH DIFFERENTIAL/PLATELET  BASIC METABOLIC PANEL  CBG MONITORING, ED    EKG None  Radiology Dg Chest 2 View  Result Date: 04/10/2018 CLINICAL DATA:  Hypoxia EXAM: CHEST - 2 VIEW COMPARISON:  None. FINDINGS: The heart size and mediastinal contours are within normal limits. Both lungs are clear. The visualized skeletal structures are unremarkable. IMPRESSION: No active cardiopulmonary disease. Electronically Signed   By: Ulyses Jarred M.D.   On: 04/10/2018 00:43   Ct Angio Chest Pe W And/or Wo Contrast  Result Date: 04/10/2018 CLINICAL DATA:  PE suspected, high pretest  probability. Tachycardia. EXAM: CT ANGIOGRAPHY CHEST WITH CONTRAST TECHNIQUE: Multidetector CT imaging of the chest was performed using the standard protocol during bolus administration of intravenous contrast. Multiplanar CT image reconstructions and MIPs were obtained to evaluate the vascular anatomy. CONTRAST:  151m ISOVUE-370 IOPAMIDOL (ISOVUE-370) INJECTION 76% COMPARISON:  None. FINDINGS: Cardiovascular: --Pulmonary arteries: Contrast injection is sufficient to demonstrate satisfactory opacification of the pulmonary arteries to the segmental level. There is no pulmonary embolus. The main pulmonary artery is within normal limits for size. --Aorta: Satisfactory opacification of the thoracic aorta. No aortic dissection or other acute aortic syndrome. Conventional 3 vessel aortic branching pattern. The aortic course and caliber are normal. There is no aortic atherosclerosis. --Heart: Normal size. No pericardial effusion. Mediastinum/Nodes: No mediastinal, hilar or axillary lymphadenopathy. The visualized thyroid and thoracic esophageal course are unremarkable. Lungs/Pleura: Mild pulmonary edema. No pleural effusion or pneumothorax. No focal airspace consolidation. Upper Abdomen: Contrast bolus timing is not optimized for evaluation of the abdominal organs. Within this limitation, the visualized organs of the upper abdomen are normal. Musculoskeletal: No chest wall abnormality. Moderate right shoulder osteoarthrosis. Review of the MIP images confirms the above findings. . IMPRESSION: 1. No pulmonary embolus or acute aortic syndrome. 2. Mild pulmonary edema. Electronically Signed   By: KUlyses JarredM.D.   On: 04/10/2018 02:46    Procedures .Critical Care Performed by: HMargarita Mail PA-C Authorized by: HMargarita Mail PA-C   Critical care provider statement:    Critical care time (minutes):  40   Critical care was necessary to treat or prevent imminent or life-threatening deterioration of the  following conditions:  Metabolic crisis   Critical care was time spent personally by me on the following activities:  Discussions with consultants, evaluation of patient's response to treatment,  examination of patient, ordering and performing treatments and interventions, ordering and review of laboratory studies, ordering and review of radiographic studies, pulse oximetry, re-evaluation of patient's condition, obtaining history from patient or surrogate and review of old charts   (including critical care time)  Medications Ordered in ED Medications - No data to display   Initial Impression / Assessment and Plan / ED Course  I have reviewed the triage vital signs and the nursing notes.  Pertinent labs & imaging results that were available during my care of the patient were reviewed by me and considered in my medical decision making (see chart for details).  Clinical Course as of Apr 11 2357  Sat Apr 11, 2018  2036 Anion gap: 14 [AH]  2037 Potassium elevated ? Hemolysis. No notable EKG changes suggestive of hyperkalemia  Will recheck potassium lab.   Potassium(!): 6.2 [AH]  2039 Troponin i, poc: 0.01 [AH]  2107 Patient potassium repeated and is accurate. Will treat with insulin drip which should normalize her potassium .    [AH]    Clinical Course User Index [AH] Margarita Mail, PA-C    It with markedly elevated blood glucose.  This is her second visit for the same.  She was placed immediately on the glucose stabilizer order set.  She is also notably hyperkalemic without EKG changes.  This is likely secondary to acute kidney injury from overdiuresis from her marketed hyperglycemia.  Believe the hyperglycemia is stemming from her underlying urinary tract infection.  Patient will be admitted to the hospitalist service.  Condition is improving throughout her visit here.  Final Clinical Impressions(s) / ED Diagnoses   Final diagnoses:  Hyperglycemia  Hyperkalemia  AKI (acute kidney  injury) [facility])    ED Discharge Orders    None       Margarita Mail, PA-C 04/12/18 0000    Malvin Johns, MD 04/12/18 0011

## 2018-04-11 NOTE — ED Triage Notes (Signed)
Reports being treated recently for UTI.  Still on antibiotics.  Reports blood sugar high today.  Will not register on meter.  Denies having any other symptoms other than urinary frequency.

## 2018-04-12 ENCOUNTER — Other Ambulatory Visit: Payer: Self-pay

## 2018-04-12 LAB — BASIC METABOLIC PANEL
Anion gap: 11 (ref 5–15)
Anion gap: 14 (ref 5–15)
Anion gap: 10 (ref 5–15)
Anion gap: 8 (ref 5–15)
BUN: 20 mg/dL (ref 6–20)
BUN: 22 mg/dL — ABNORMAL HIGH (ref 6–20)
BUN: 22 mg/dL — ABNORMAL HIGH (ref 6–20)
BUN: 23 mg/dL — ABNORMAL HIGH (ref 6–20)
CO2: 25 mmol/L (ref 22–32)
CO2: 24 mmol/L (ref 22–32)
CO2: 22 mmol/L (ref 22–32)
CO2: 23 mmol/L (ref 22–32)
CREATININE: 1.4 mg/dL — AB (ref 0.44–1.00)
Calcium: 10.2 mg/dL (ref 8.9–10.3)
Calcium: 10.1 mg/dL (ref 8.9–10.3)
Calcium: 9.4 mg/dL (ref 8.9–10.3)
Calcium: 9.5 mg/dL (ref 8.9–10.3)
Chloride: 103 mmol/L (ref 98–111)
Chloride: 100 mmol/L (ref 98–111)
Chloride: 105 mmol/L (ref 98–111)
Chloride: 105 mmol/L (ref 98–111)
Creatinine, Ser: 1.43 mg/dL — ABNORMAL HIGH (ref 0.44–1.00)
Creatinine, Ser: 1.22 mg/dL — ABNORMAL HIGH (ref 0.44–1.00)
Creatinine, Ser: 1.17 mg/dL — ABNORMAL HIGH (ref 0.44–1.00)
GFR calc Af Amer: 47 mL/min — ABNORMAL LOW (ref >60)
GFR calc Af Amer: 46 mL/min — ABNORMAL LOW (ref >60)
GFR calc Af Amer: 56 mL/min — ABNORMAL LOW (ref >60)
GFR calc Af Amer: 59 mL/min — ABNORMAL LOW (ref >60)
GFR calc non Af Amer: 41 mL/min — ABNORMAL LOW (ref >60)
GFR calc non Af Amer: 40 mL/min — ABNORMAL LOW (ref >60)
GFR calc non Af Amer: 48 mL/min — ABNORMAL LOW (ref >60)
GFR calc non Af Amer: 51 mL/min — ABNORMAL LOW (ref >60)
Glucose, Bld: 242 mg/dL — ABNORMAL HIGH (ref 70–99)
Glucose, Bld: 220 mg/dL — ABNORMAL HIGH (ref 70–99)
Glucose, Bld: 134 mg/dL — ABNORMAL HIGH (ref 70–99)
Glucose, Bld: 234 mg/dL — ABNORMAL HIGH (ref 70–99)
POTASSIUM: 4.5 mmol/L (ref 3.5–5.1)
Potassium: 3.9 mmol/L (ref 3.5–5.1)
Potassium: 3.8 mmol/L (ref 3.5–5.1)
Potassium: 4.1 mmol/L (ref 3.5–5.1)
Sodium: 139 mmol/L (ref 135–145)
Sodium: 138 mmol/L (ref 135–145)
Sodium: 137 mmol/L (ref 135–145)
Sodium: 136 mmol/L (ref 135–145)

## 2018-04-12 LAB — GLUCOSE, CAPILLARY
GLUCOSE-CAPILLARY: 237 mg/dL — AB (ref 70–99)
Glucose-Capillary: 146 mg/dL — ABNORMAL HIGH (ref 70–99)
Glucose-Capillary: 154 mg/dL — ABNORMAL HIGH (ref 70–99)
Glucose-Capillary: 203 mg/dL — ABNORMAL HIGH (ref 70–99)
Glucose-Capillary: 210 mg/dL — ABNORMAL HIGH (ref 70–99)
Glucose-Capillary: 222 mg/dL — ABNORMAL HIGH (ref 70–99)
Glucose-Capillary: 234 mg/dL — ABNORMAL HIGH (ref 70–99)
Glucose-Capillary: 237 mg/dL — ABNORMAL HIGH (ref 70–99)
Glucose-Capillary: 241 mg/dL — ABNORMAL HIGH (ref 70–99)
Glucose-Capillary: 249 mg/dL — ABNORMAL HIGH (ref 70–99)
Glucose-Capillary: 272 mg/dL — ABNORMAL HIGH (ref 70–99)
Glucose-Capillary: 276 mg/dL — ABNORMAL HIGH (ref 70–99)
Glucose-Capillary: 287 mg/dL — ABNORMAL HIGH (ref 70–99)

## 2018-04-12 LAB — URINALYSIS, ROUTINE W REFLEX MICROSCOPIC
Bilirubin Urine: NEGATIVE
Glucose, UA: >=500 mg/dL — AB
Hgb urine dipstick: NEGATIVE
Ketones, ur: NEGATIVE mg/dL
Leukocytes, UA: NEGATIVE
Nitrite: NEGATIVE
PH: 5 (ref 5.0–8.0)
Protein, ur: NEGATIVE mg/dL
Specific Gravity, Urine: 1.021 (ref 1.005–1.030)

## 2018-04-12 LAB — CBC
HCT: 44.6 % (ref 36.0–46.0)
HEMATOCRIT: 44.3 % (ref 36.0–46.0)
HEMOGLOBIN: 14.3 g/dL (ref 12.0–15.0)
Hemoglobin: 14.2 g/dL (ref 12.0–15.0)
MCH: 27.8 pg (ref 26.0–34.0)
MCH: 28.5 pg (ref 26.0–34.0)
MCHC: 31.8 g/dL (ref 30.0–36.0)
MCHC: 32.3 g/dL (ref 30.0–36.0)
MCV: 87.3 fL (ref 80.0–100.0)
MCV: 88.4 fL (ref 80.0–100.0)
PLATELETS: 305 10*3/uL (ref 150–400)
Platelets: 330 10*3/uL (ref 150–400)
RBC: 5.01 MIL/uL (ref 3.87–5.11)
RBC: 5.11 MIL/uL (ref 3.87–5.11)
RDW: 12.6 % (ref 11.5–15.5)
RDW: 12.8 % (ref 11.5–15.5)
WBC: 9 10*3/uL (ref 4.0–10.5)
WBC: 9.2 10*3/uL (ref 4.0–10.5)
nRBC: 0 % (ref 0.0–0.2)
nRBC: 0 % (ref 0.0–0.2)

## 2018-04-12 LAB — MRSA PCR SCREENING: MRSA by PCR: NEGATIVE

## 2018-04-12 LAB — HEMOGLOBIN A1C
Hgb A1c MFr Bld: 13 % — ABNORMAL HIGH (ref 4.8–5.6)
Mean Plasma Glucose: 326.4 mg/dL

## 2018-04-12 MED ORDER — INSULIN ASPART 100 UNIT/ML ~~LOC~~ SOLN
0.0000 [IU] | Freq: Three times a day (TID) | SUBCUTANEOUS | Status: DC
  Administered 2018-04-12: 5 [IU] via SUBCUTANEOUS
  Administered 2018-04-12: 8 [IU] via SUBCUTANEOUS
  Administered 2018-04-13 (×2): 5 [IU] via SUBCUTANEOUS

## 2018-04-12 MED ORDER — INSULIN GLARGINE 100 UNIT/ML ~~LOC~~ SOLN
50.0000 [IU] | Freq: Two times a day (BID) | SUBCUTANEOUS | Status: DC
  Administered 2018-04-12 (×2): 50 [IU] via SUBCUTANEOUS
  Filled 2018-04-12 (×3): qty 0.5

## 2018-04-12 MED ORDER — CIPROFLOXACIN HCL 500 MG PO TABS
500.0000 mg | ORAL_TABLET | Freq: Two times a day (BID) | ORAL | Status: DC
  Administered 2018-04-12 – 2018-04-13 (×3): 500 mg via ORAL
  Filled 2018-04-12 (×3): qty 1

## 2018-04-12 MED ORDER — INSULIN ASPART 100 UNIT/ML ~~LOC~~ SOLN
0.0000 [IU] | Freq: Every day | SUBCUTANEOUS | Status: DC
  Administered 2018-04-12: 3 [IU] via SUBCUTANEOUS

## 2018-04-12 MED ORDER — PNEUMOCOCCAL VAC POLYVALENT 25 MCG/0.5ML IJ INJ: 0.5000 mL | INJECTION | INTRAMUSCULAR | Status: DC | PRN

## 2018-04-12 NOTE — Progress Notes (Signed)
PROGRESS NOTE    Ann Pace  WUJ:[id number] DOB: 08/18/1955 DOA: 04/11/2018 PCP: Storm Frisk, MD   Brief Narrative: 63 year old female with history of uncontrolled diabetes, recent UTI, depression, peripheral neuropathy who has been running " high" blood sugar at home, feeling weak, came to the ER for evaluation of ongoing hyperglycemia weakness.  She had some dysuria and was on antibiotics recently started. In the ER vitals were stable, still tachycardic in 150s, routine blood work showed blood sugar 799, was given IV fluid boluses and placed on insulin drip and admitted  Subjective: Seen and examined this morning.  No nausea vomiting chest pain.  She tolerated diet well, blood sugar was in 130s to 140s in am and after meal as come up into 240s, also on insulin drip and dextrose drip.  Assessment & Plan:   Diabetes mellitus with uncontrolled hyperglycemia, with hyperosmolar nonketotic state: HbA1c 13.0 04/12/18 . After IV fluids, IV insulin blood sugar this morning 130s, she ate her breakfast and now in 240s, was also placed on dextrose ivf.  Her anion gap is normal.  Patient is tolerating p.o. well no nausea vomiting abdominal pain.  I will stop her IV insulin infusion, transitio to subcu Lantus and sliding scale and monitor closely.  Patient reports her sugar has been running high after she got her cortisone injection in her joint in December. Recent Labs  Lab 04/12/18 0457 04/12/18 0547 04/12/18 0645 04/12/18 0753 04/12/18 0925  GLUCAP 249* 237* 154* 146* 241*   Hyperkalemia in the setting of hyperglycemia and dehydration.  Resolved with IV fluid.  Hypertension: Blood pressure is stable.  Urinary tract infection without hematuria, continue her Cipro.  Check UA.  Overall stable.  CKD stage III, creatinine around baseline of 1.2-1.6.  DVT prophylaxis: Heparin Code Status: Full Family Communication: Sister at the bedside Disposition Plan: Remains inpatient pending clinical  improvement Consultants: none Procedures: None  Antimicrobials: Anti-infectives (From admission, onward)   Start     Dose/Rate Route Frequency Ordered Stop   04/12/18 0945  ciprofloxacin (CIPRO) tablet 500 mg     500 mg Oral 2 times daily 04/12/18 0940 04/15/18 0759   04/11/18 2345  fluconazole (DIFLUCAN) tablet 150 mg  Status:  Discontinued     150 mg Oral Weekly 04/11/18 2344 04/11/18 2355       Objective: Vitals:   04/11/18 2236 04/11/18 2312 04/11/18 2334 04/12/18 0345  BP: (!) 154/88  (!) 150/82 137/78  Pulse: (!) 111  (!) 111 97  Resp: (!) 29  (!) 27 16  Temp:  98.6 F (37 C) 98.6 F (37 C) 98 F (36.7 C)  TempSrc:  Oral Oral Oral  SpO2: 98%  95% 97%  Weight:    82.4 kg  Height:        Intake/Output Summary (Last 24 hours) at 04/12/2018 0940 Last data filed at 04/12/2018 0850 Gross per 24 hour  Intake 1055.7 ml  Output 350 ml  Net 705.7 ml   Filed Weights   04/11/18 1914 04/12/18 0345  Weight: 83 kg 82.4 kg   Weight change:   Body mass index is 31.17 kg/m.  Intake/Output from previous day: 01/18 0701 - 01/19 0700 In: 1055.7 [P.O.:480; I.V.:575.7] Out: -  Intake/Output this shift: Total I/O In: -  Out: 350 [Urine:350]  Examination:  General exam: Appears calm and comfortable, obese, not in distress.   HEENT:PERRL,Oral mucosa moist, Ear/Nose normal on gross exam Respiratory system: Bilateral equal air entry, normal vesicular breath sounds, no  wheezes or crackles  Cardiovascular system: S1 & S2 heard,No JVD, murmurs. Gastrointestinal system: Abdomen is  soft, non tender, non distended, BS +  Nervous System:Alert and oriented. No focal neurological deficits/moving extremities, sensation intact. Extremities: No edema, no clubbing, distal peripheral pulses palpable. Skin: No rashes, lesions, no icterus MSK: Normal muscle bulk,tone ,power  Medications:  Scheduled Meds: . allopurinol  300 mg Oral Daily  . buPROPion  300 mg Oral Daily  . celecoxib   200 mg Oral Daily  . ciprofloxacin  500 mg Oral BID  . DULoxetine  60 mg Oral Daily  . gabapentin  300 mg Oral QHS  . heparin  5,000 Units Subcutaneous Q8H  . insulin aspart  0-15 Units Subcutaneous TID WC  . insulin aspart  0-5 Units Subcutaneous QHS  . insulin glargine  50 Units Subcutaneous BID  . insulin regular  0-10 Units Intravenous TID WC  . omega-3 acid ethyl esters  2 g Oral BID  . rosuvastatin  20 mg Oral Daily   Continuous Infusions: . sodium chloride      Data Reviewed: I have personally reviewed following labs and imaging studies  CBC: Recent Labs  Lab 04/09/18 2018 04/11/18 1847 04/11/18 2050 04/11/18 2054 04/12/18 0007 04/12/18 0218  WBC 11.9* 8.9  --   --  9.2 9.0  NEUTROABS 7.6 6.5  --   --   --   --   HGB 15.1* 13.8 15.0 14.6 14.2 14.3  HCT 47.1* 44.4 44.0 43.0 44.6 44.3  MCV 86.9 90.1  --   --  87.3 88.4  PLT 352 267  --   --  305 330   Basic Metabolic Panel: Recent Labs  Lab 04/09/18 2018 04/11/18 1847 04/11/18 2050 04/11/18 2054 04/12/18 0007 04/12/18 0218 04/12/18 0731  NA 134* 125* 129* 129* 139 138 137  K 4.1 6.2* 6.6* 6.5* 3.9 3.8 4.1  CL 96* 91* 99 98 103 100 105  CO2 25 20*  --   --  25 24 22   GLUCOSE 402* 776* >700* >700* 242* 220* 134*  BUN 22* 27* 41* 38* 22* 23* 22*  CREATININE 1.40* 1.68* 1.40* 1.50* 1.40* 1.43* 1.22*  CALCIUM 10.4* 9.0  --   --  10.2 10.1 9.4   GFR: Estimated Creatinine Clearance: 50.9 mL/min (A) (by C-G formula based on SCr of 1.22 mg/dL (H)). Liver Function Tests: Recent Labs  Lab 04/09/18 2018  AST 15  ALT 19  ALKPHOS 149*  BILITOT 0.4  PROT 6.9  ALBUMIN 3.8   No results for input(s): LIPASE, AMYLASE in the last 168 hours. No results for input(s): AMMONIA in the last 168 hours. Coagulation Profile: No results for input(s): INR, PROTIME in the last 168 hours. Cardiac Enzymes: No results for input(s): CKTOTAL, CKMB, CKMBINDEX, TROPONINI in the last 168 hours. BNP (last 3 results) No results  for input(s): PROBNP in the last 8760 hours. HbA1C: Recent Labs    04/12/18 0007  HGBA1C 13.0*   CBG: Recent Labs  Lab 04/12/18 0457 04/12/18 0547 04/12/18 0645 04/12/18 0753 04/12/18 0925  GLUCAP 249* 237* 154* 146* 241*   Lipid Profile: No results for input(s): CHOL, HDL, LDLCALC, TRIG, CHOLHDL, LDLDIRECT in the last 72 hours. Thyroid Function Tests: No results for input(s): TSH, T4TOTAL, FREET4, T3FREE, THYROIDAB in the last 72 hours. Anemia Panel: No results for input(s): VITAMINB12, FOLATE, FERRITIN, TIBC, IRON, RETICCTPCT in the last 72 hours. Sepsis Labs: No results for input(s): PROCALCITON, LATICACIDVEN in the last 168 hours.  Recent Results (from the past 240 hour(s))  Urine culture     Status: None   Collection Time: 04/06/18  5:16 PM  Result Value Ref Range Status   Specimen Description URINE, RANDOM  Final   Special Requests   Final    NONE Performed at [facility], [address] [place], [place] [address]    Culture   Final    Multiple bacterial morphotypes present, none predominant. Suggest appropriate recollection if clinically indicated.   Report Status 04/08/2018 FINAL  Final  Urine Culture     Status: Abnormal   Collection Time: 04/09/18  9:10 AM  Result Value Ref Range Status   Specimen Description URINE, RANDOM  Final   Special Requests   Final    NONE Performed at [facility], [address] [place], [place] [address]    Culture MULTIPLE SPECIES PRESENT, SUGGEST RECOLLECTION (A)  Final   Report Status 04/11/2018 FINAL  Final      Radiology Studies: No results found.    LOS: 1 day   Time spent: More than 50% of that time was spent in counseling and/or coordination of care.  Lanae Boastamesh Rodarius Kichline, MD [facility]  04/12/2018, 9:40 AM

## 2018-04-13 LAB — BASIC METABOLIC PANEL
Anion gap: 10 (ref 5–15)
BUN: 22 mg/dL — ABNORMAL HIGH (ref 6–20)
CO2: 23 mmol/L (ref 22–32)
Calcium: 9.6 mg/dL (ref 8.9–10.3)
Chloride: 105 mmol/L (ref 98–111)
Creatinine, Ser: 1.22 mg/dL — ABNORMAL HIGH (ref 0.44–1.00)
GFR calc Af Amer: 56 mL/min — ABNORMAL LOW (ref >60)
GFR, EST NON AFRICAN AMERICAN: 48 mL/min — AB (ref >60)
Glucose, Bld: 252 mg/dL — ABNORMAL HIGH (ref 70–99)
Potassium: 4.3 mmol/L (ref 3.5–5.1)
Sodium: 138 mmol/L (ref 135–145)

## 2018-04-13 LAB — POCT I-STAT, CHEM 8
BUN: 41 mg/dL — ABNORMAL HIGH (ref 6–20)
CALCIUM ION: 1.11 mmol/L — AB (ref 1.15–1.40)
Chloride: 99 mmol/L (ref 98–111)
Creatinine, Ser: 1.4 mg/dL — ABNORMAL HIGH (ref 0.44–1.00)
Glucose, Bld: >700 mg/dL (ref 70–99)
HCT: 44 % (ref 36.0–46.0)
Hemoglobin: 15 g/dL (ref 12.0–15.0)
Potassium: 6.6 mmol/L (ref 3.5–5.1)
Sodium: 129 mmol/L — ABNORMAL LOW (ref 135–145)
TCO2: 25 mmol/L (ref 22–32)

## 2018-04-13 LAB — GLUCOSE, CAPILLARY
Glucose-Capillary: 235 mg/dL — ABNORMAL HIGH (ref 70–99)
Glucose-Capillary: 227 mg/dL — ABNORMAL HIGH (ref 70–99)

## 2018-04-13 MED ORDER — INSULIN GLARGINE 100 UNIT/ML ~~LOC~~ SOLN
55.0000 [IU] | Freq: Two times a day (BID) | SUBCUTANEOUS | Status: DC
  Administered 2018-04-13: 55 [IU] via SUBCUTANEOUS
  Filled 2018-04-13: qty 0.55

## 2018-04-13 MED ORDER — INSULIN GLARGINE 100 UNIT/ML SOLOSTAR PEN: PEN_INJECTOR | SUBCUTANEOUS | 11 refills | Status: DC

## 2018-04-13 NOTE — Discharge Summary (Signed)
Physician Discharge Summary  Ann Pace IPJ:[id number] DOB: 09-30-1955 DOA: 04/11/2018  PCP: Elsie Stain, MD  Admit date: 04/11/2018 Discharge date: 04/13/2018  Admitted From: Home Disposition: Home  Recommendations for Outpatient Follow-up:  1. Follow up with PCP in 2-3 days w sugar  Log book  Home Health: No Equipment/Devices: No  Discharge Condition: stable CODE STATUS: Full code Diet recommendation: Diabetic diet  Brief/Interim Summary: 63 year old female with history of uncontrolled diabetes, recent UTI, depression, peripheral neuropathy who has been running " high" blood sugar at home, feeling weak, came to the ER for evaluation of ongoing hyperglycemia weakness.  She had some dysuria and was on antibiotics recently started. In the ER vitals were stable, still tachycardic in 150s, routine blood work showed blood sugar 799, was given IV fluid boluses and placed on insulin drip and admitted.  Patient was subsequently switched to subcu insulin, sugar has been stable in mid 200s, she is advised to monitor blood sugar at home and go up on Lantus/adjust insulin for which she will need to follow-up with her PCP and she states she can do that in 2 days.  I have asked her to maintain logbook.  I did discuss about dietary restriction   Assessment & Plan:   Diabetes mellitus with uncontrolled hyperglycemia, with hyperosmolar nonketotic state: HbA1c 13.0 04/12/18 . After IV fluids, IV insulin blood sugar has improved.  Now on Lantus, need for 12 minutes twice a day and may need to go up for which she will need to follow-up with PCP.   Patient reports her sugar has been running high after she got her cortisone injection in her joint in December after her UTI.Marland Kitchen  Hyperkalemia in the setting of hyperglycemia and dehydration.  Resolved with IV fluid.  Hypertension: Blood pressure is stable.  Urinary tract infection without hematuria, continue her Cipro-patient she has Cipro from her last  prescription and she will continue the course to complete.  CKD stage III, creatinine around baseline of 1.2-1.6.  Discharge Diagnoses:  Principal Problem:   Hyperglycemia Active Problems:   Peripheral polyneuropathy   Urinary tract infection without hematuria   Dyslipidemia associated with type 2 diabetes mellitus ([place])   Essential hypertension   Hyperkalemia    Discharge Instructions  Discharge Instructions    Call MD for:  persistant nausea and vomiting   Complete by:  As directed    Diet Carb Modified   Complete by:  As directed    Discharge instructions   Complete by:  As directed    Check blood sugar 3 times a day and bedtime at home. If blood sugar running above 200 less than 80 please call your MD to adjust insulin. If blood sugars running less 100 do not use insulin and call MD. If you noticed signs and symptoms of hypoglycemia or low blood sugar like jitteriness, confusion, thirst, tremor, sweating- Check blood sugar, drink sugary drink/biscuits/sweets to increase sugar level and call MD or return to ER.   Increase activity slowly   Complete by:  As directed      Allergies as of 04/13/2018      Reactions   Cortisone Other (See Comments)   Cortisone shots make BGL go too high   Penicillins Other (See Comments)   Pt reports "pressure in her head"- "a bad headache" Has patient had a PCN reaction causing immediate rash, facial/tongue/throat swelling, SOB or lightheadedness with hypotension: No Has patient had a PCN reaction causing severe rash involving mucus membranes or skin  necrosis: No Has patient had a PCN reaction that required hospitalization: No Has patient had a PCN reaction occurring within the last 10 years: No If all of the above answers are "NO", then may proceed with Cephalosporin use.      Medication List    TAKE these medications   ACCU-CHEK AVIVA PLUS w/Device Kit 1 each by Does not apply route daily.   accu-chek soft touch lancets Use  TID.   allopurinol 300 MG tablet Commonly known as:  ZYLOPRIM Take 1 tablet (300 mg total) by mouth daily.   buPROPion 300 MG 24 hr tablet Commonly known as:  WELLBUTRIN XL Take 1 tablet (300 mg total) by mouth daily.   celecoxib 200 MG capsule Commonly known as:  CELEBREX Take 1 capsule (200 mg total) by mouth 2 (two) times daily. What changed:  when to take this   dapagliflozin propanediol 10 MG Tabs tablet Commonly known as:  FARXIGA Take 10 mg by mouth daily.   DULoxetine 60 MG capsule Commonly known as:  CYMBALTA Take 1 capsule (60 mg total) by mouth daily.   gabapentin 300 MG capsule Commonly known as:  NEURONTIN Take 1 capsule (300 mg total) by mouth at bedtime.   glucose blood test strip Commonly known as:  ACCU-CHEK AVIVA Use TID.   Insulin Glargine 100 UNIT/ML Solostar Pen Commonly known as:  LANTUS SOLOSTAR Inject 55 units at every 12 hours What changed:  additional instructions   lisinopril 10 MG tablet Commonly known as:  PRINIVIL,ZESTRIL Take 1 tablet (10 mg total) by mouth daily.   naproxen 500 MG tablet Commonly known as:  NAPROSYN Take 500 mg by mouth 2 (two) times daily as needed (for nerve pain).   NOVOLOG FLEXPEN 100 UNIT/ML FlexPen Generic drug:  insulin aspart Inject 2-10 Units into the skin See admin instructions. Inject 2-10 units subcutaneously three times daily before meals per sliding scale: CBG 150-200 = 2 units;  201-251 =  4 units; 251-300 = 6 units;  301-350 = 8 units;  351-400 = 10 units   omega-3 acid ethyl esters 1 g capsule Commonly known as:  LOVAZA Take 2 capsules (2 g total) by mouth 2 (two) times daily.   ondansetron 8 MG disintegrating tablet Commonly known as:  ZOFRAN ODT Take 1 tablet (8 mg total) by mouth every 8 (eight) hours as needed for nausea or vomiting. 30m ODT q4 hours prn nausea   Pen Needles 30G X 8 MM Misc Inject 1 application into the skin 5 (five) times daily.   rosuvastatin 20 MG tablet Commonly  known as:  CRESTOR Take 1 tablet (20 mg total) by mouth daily.   sulfamethoxazole-trimethoprim 800-160 MG tablet Commonly known as:  BACTRIM DS,SEPTRA DS Take 1 tablet by mouth 2 (two) times daily. What changed:  additional instructions       Allergies  Allergen Reactions  . Cortisone Other (See Comments)    Cortisone shots make BGL go too high  . Penicillins Other (See Comments)    Pt reports "pressure in her head"- "a bad headache" Has patient had a PCN reaction causing immediate rash, facial/tongue/throat swelling, SOB or lightheadedness with hypotension: No Has patient had a PCN reaction causing severe rash involving mucus membranes or skin necrosis: No Has patient had a PCN reaction that required hospitalization: No Has patient had a PCN reaction occurring within the last 10 years: No If all of the above answers are "NO", then may proceed with Cephalosporin use.  Consultations:  None   Procedures/Studies: Dg Chest 2 View  Result Date: 04/10/2018 CLINICAL DATA:  Hypoxia EXAM: CHEST - 2 VIEW COMPARISON:  None. FINDINGS: The heart size and mediastinal contours are within normal limits. Both lungs are clear. The visualized skeletal structures are unremarkable. IMPRESSION: No active cardiopulmonary disease. Electronically Signed   By: Ulyses Jarred M.D.   On: 04/10/2018 00:43   Dg Chest 2 View  Result Date: 03/16/2018 CLINICAL DATA:  Right-sided chest pain and cough for few days EXAM: CHEST - 2 VIEW COMPARISON:  03/02/2018 FINDINGS: Cardiac shadows within normal limits. The lungs are well aerated bilaterally. No focal infiltrate or sizable effusion is noted. No acute bony abnormality is seen. IMPRESSION: No active cardiopulmonary disease. Electronically Signed   By: Inez Catalina M.D.   On: 03/16/2018 20:37   Ct Angio Chest Pe W And/or Wo Contrast  Result Date: 04/10/2018 CLINICAL DATA:  PE suspected, high pretest probability. Tachycardia. EXAM: CT ANGIOGRAPHY CHEST WITH  CONTRAST TECHNIQUE: Multidetector CT imaging of the chest was performed using the standard protocol during bolus administration of intravenous contrast. Multiplanar CT image reconstructions and MIPs were obtained to evaluate the vascular anatomy. CONTRAST:  163m ISOVUE-370 IOPAMIDOL (ISOVUE-370) INJECTION 76% COMPARISON:  None. FINDINGS: Cardiovascular: --Pulmonary arteries: Contrast injection is sufficient to demonstrate satisfactory opacification of the pulmonary arteries to the segmental level. There is no pulmonary embolus. The main pulmonary artery is within normal limits for size. --Aorta: Satisfactory opacification of the thoracic aorta. No aortic dissection or other acute aortic syndrome. Conventional 3 vessel aortic branching pattern. The aortic course and caliber are normal. There is no aortic atherosclerosis. --Heart: Normal size. No pericardial effusion. Mediastinum/Nodes: No mediastinal, hilar or axillary lymphadenopathy. The visualized thyroid and thoracic esophageal course are unremarkable. Lungs/Pleura: Mild pulmonary edema. No pleural effusion or pneumothorax. No focal airspace consolidation. Upper Abdomen: Contrast bolus timing is not optimized for evaluation of the abdominal organs. Within this limitation, the visualized organs of the upper abdomen are normal. Musculoskeletal: No chest wall abnormality. Moderate right shoulder osteoarthrosis. Review of the MIP images confirms the above findings. . IMPRESSION: 1. No pulmonary embolus or acute aortic syndrome. 2. Mild pulmonary edema. Electronically Signed   By: KUlyses JarredM.D.   On: 04/10/2018 02:46    Subjective: Resting comfortably.  Tolerating diet. No acute events overnight.  Sugar fairly stable.  Discharge Exam: Vitals:   04/12/18 2037 04/13/18 0427  BP: 117/72 (!) 115/97  Pulse: 97 92  Resp: (!) 21 18  Temp: 98.4 F (36.9 C) 98.4 F (36.9 C)  SpO2: 97% 100%   Vitals:   04/12/18 0850 04/12/18 1629 04/12/18 2037 04/13/18  0427  BP: (!) 142/64 136/64 117/72 (!) 115/97  Pulse: 90 (!) 102 97 92  Resp: (!) 21 (!) 21 (!) 21 18  Temp: 98.7 F (37.1 C) 98.4 F (36.9 C) 98.4 F (36.9 C) 98.4 F (36.9 C)  TempSrc: Oral Oral Oral Oral  SpO2: 97%  97% 100%  Weight:    82.2 kg  Height:        General: Pt is alert, awake, not in acute distress Cardiovascular: RRR, S1/S2 +, no rubs, no gallops Respiratory: CTA bilaterally, no wheezing, no rhonchi Abdominal: Soft, NT, ND, bowel sounds + Extremities: no edema, no cyanosis   The results of significant diagnostics from this hospitalization (including imaging, microbiology, ancillary and laboratory) are listed below for reference.     Microbiology: Recent Results (from the past 240 hour(s))  Urine culture  Status: None   Collection Time: 04/06/18  5:16 PM  Result Value Ref Range Status   Specimen Description URINE, RANDOM  Final   Special Requests   Final    NONE Performed at [facility], [place] [address] [place], [place] [address]    Culture   Final    Multiple bacterial morphotypes present, none predominant. Suggest appropriate recollection if clinically indicated.   Report Status 04/08/2018 FINAL  Final  Urine Culture     Status: Abnormal   Collection Time: 04/09/18  9:10 AM  Result Value Ref Range Status   Specimen Description URINE, RANDOM  Final   Special Requests   Final    NONE Performed at [facility], [address] [place], [place] [address]    Culture MULTIPLE SPECIES PRESENT, SUGGEST RECOLLECTION (A)  Final   Report Status 04/11/2018 FINAL  Final  MRSA PCR Screening     Status: None   Collection Time: 04/12/18 12:12 PM  Result Value Ref Range Status   MRSA by PCR NEGATIVE NEGATIVE Final    Comment:        The GeneXpert MRSA Assay (FDA approved for NASAL specimens only), is one component of a comprehensive MRSA colonization surveillance program. It is not intended to diagnose MRSA infection nor to guide or monitor  treatment for MRSA infections. Performed at [facility], [place] [address], [place],  [address]      Labs: BNP (last 3 results) No results for input(s): BNP in the last 8760 hours. Basic Metabolic Panel: Recent Labs  Lab 04/12/18 0007 04/12/18 0218 04/12/18 0731 04/12/18 1131 04/13/18 0412  NA 139 138 137 136 138  K 3.9 3.8 4.1 4.5 4.3  CL 103 100 105 105 105  CO2 _0 GLUCOSE 242* 220* 134* 234* 252*  BUN 22* 23* 22* 20 22*  CREATININE 1.40* 1.43* 1.22* 1.17* 1.22*  CALCIUM 10.2 10.1 9.4 9.5 9.6   Liver Function Tests: Recent Labs  Lab 04/09/18 2018  AST 15  ALT 19  ALKPHOS 149*  BILITOT 0.4  PROT 6.9  ALBUMIN 3.8   No results for input(s): LIPASE, AMYLASE in the last 168 hours. No results for input(s): AMMONIA in the last 168 hours. CBC: Recent Labs  Lab 04/09/18 2018 04/11/18 1847 04/11/18 2050 04/11/18 2054 04/12/18 0007 04/12/18 0218  WBC 11.9* 8.9  --   --  9.2 9.0  NEUTROABS 7.6 6.5  --   --   --   --   HGB 15.1* 13.8 15.0 14.6 14.2 14.3  HCT 47.1* 44.4 44.0 43.0 44.6 44.3  MCV 86.9 90.1  --   --  87.3 88.4  PLT 352 267  --   --  305 330   Cardiac Enzymes: No results for input(s): CKTOTAL, CKMB, CKMBINDEX, TROPONINI in the last 168 hours. BNP: Invalid input(s): POCBNP CBG: Recent Labs  Lab 04/12/18 0925 04/12/18 1146 04/12/18 1656 04/12/18 2116 04/13/18 0742  GLUCAP 241* 210* 272* 276* 235*   D-Dimer No results for input(s): DDIMER in the last 72 hours. Hgb A1c Recent Labs    04/12/18 0007  HGBA1C 13.0*   Lipid Profile No results for input(s): CHOL, HDL, LDLCALC, TRIG, CHOLHDL, LDLDIRECT in the last 72 hours. Thyroid function studies No results for input(s): TSH, T4TOTAL, T3FREE, THYROIDAB in the last 72 hours.  Invalid input(s): FREET3 Anemia work up No results for input(s): VITAMINB12, FOLATE, FERRITIN, TIBC, IRON, RETICCTPCT in the last 46  hours. Urinalysis    Component Value Date/Time   COLORURINE  YELLOW 04/12/2018 1212   APPEARANCEUR CLEAR 04/12/2018 1212   LABSPEC 1.021 04/12/2018 1212   PHURINE 5.0 04/12/2018 1212   GLUCOSEU >=500 (A) 04/12/2018 1212   HGBUR NEGATIVE 04/12/2018 1212   BILIRUBINUR NEGATIVE 04/12/2018 1212   BILIRUBINUR negative 04/09/2018 [place] 04/12/2018 1212   PROTEINUR NEGATIVE 04/12/2018 1212   UROBILINOGEN 0.2 04/09/2018 1617   UROBILINOGEN 1.0 04/06/2018 1715   NITRITE NEGATIVE 04/12/2018 1212   LEUKOCYTESUR NEGATIVE 04/12/2018 1212   Sepsis Labs Invalid input(s): PROCALCITONIN,  WBC,  LACTICIDVEN Microbiology Recent Results (from the past 240 hour(s))  Urine culture     Status: None   Collection Time: 04/06/18  5:16 PM  Result Value Ref Range Status   Specimen Description URINE, RANDOM  Final   Special Requests   Final    NONE Performed at [facility], [place] [address], [place], [place] [address]    Culture   Final    Multiple bacterial morphotypes present, none predominant. Suggest appropriate recollection if clinically indicated.   Report Status 04/08/2018 FINAL  Final  Urine Culture     Status: Abnormal   Collection Time: 04/09/18  9:10 AM  Result Value Ref Range Status   Specimen Description URINE, RANDOM  Final   Special Requests   Final    NONE Performed at [facility], [address], [place], [place] [address]    Culture MULTIPLE SPECIES PRESENT, SUGGEST RECOLLECTION (A)  Final   Report Status 04/11/2018 FINAL  Final  MRSA PCR Screening     Status: None   Collection Time: 04/12/18 12:12 PM  Result Value Ref Range Status   MRSA by PCR NEGATIVE NEGATIVE Final    Comment:        The GeneXpert MRSA Assay (FDA approved for NASAL specimens only), is one component of a comprehensive MRSA colonization surveillance program. It is not intended to diagnose MRSA infection nor to guide or monitor treatment for MRSA infections. Performed at [facility], [place] [address], [place], [place] [address]       Time coordinating discharge: 25 minutes  SIGNED:   Antonieta Pert, MD  [facility] 04/13/2018, 9:36 AM  If 7PM-7AM, please contact night-coverage [email or web address]

## 2018-04-13 NOTE — Progress Notes (Signed)
Inpatient Diabetes Program Recommendations  AACE/ADA: New Consensus Statement on Inpatient Glycemic Control (2015)  Target Ranges:  Prepandial:   less than 140 mg/dL      Peak postprandial:   less than 180 mg/dL (1-2 hours)      Critically ill patients:  140 - 180 mg/dL   Lab Results  Component Value Date   GLUCAP 227 (H) 04/13/2018   HGBA1C 13.0 (H) 04/12/2018    Review of Glycemic Control Results for RISE, BOUIE (MRN [id number]) as of 04/13/2018 12:50  Ref. Range 04/12/2018 16:56 04/12/2018 21:16 04/13/2018 07:42 04/13/2018 12:09  Glucose-Capillary Latest Ref Range: 70 - 99 mg/dL 239 (H) 532 (H) 023 (H) 227 (H)   Diabetes history: Type 2 DM Outpatient Diabetes medications: Farxiga 10 mg QD, Novolog 2-10 units TID, Lantus 60 units QAM, 50 units QPM Current orders for Inpatient glycemic control: Lantus 55 units BID, Novolog 0-15 units TID, Novolog 0-5 units QHS  Inpatient Diabetes Program Recommendations:    Spoke with patient at length regarding outpatient diabetes management. Patient is tearful and expresses frustration with controlling blood sugars.  Reviewed patient's current A1c of 13% which is up from 11.4 (in December). Explained what a A1c is and what it measures. Also reviewed goal A1c with patient, importance of good glucose control @ home, and blood sugar goals. Reviewed patho of DM, need for additional insulin, role of pancreas, HHNK, side effects of SGLT-2, especially in the setting of recent steroid injections, UTI and vaginal yeast infection, steroidal impact to blood glucose, carb counting and comorbidites of poor glycemic control.  Patient has a meter and has been checking 3 times per day. However, values range from 180-540 mg/dL. "The current plan just ain cutting it." When asked about patterns, patient states they are higher post prandially.  Patient denies drinking sugary beverages and reviewed common meals with patient. Based off of what she reports, carbohydrates per  meal are not exceeding 50 g.  Encouraged more frequent CBG checks: add on CBG checks 2 hours following meals, write down meals/times, and take this with her to next appointment. Reviewed when to call MD and questions to ask. Patient is to follow up with [facility]. Reviewed goals and priority items to ask about at this visit. Reviewed discharging insulin schedule and changes. Patient has no further questions.   Thanks, Lujean Rave, MSN, RNC-OB Diabetes Coordinator [phone] (8a-5p)

## 2018-04-13 NOTE — Care Management Note (Signed)
Case Management Note  Patient Details  Name: [name] MRN: [id number] Date of Birth: 1955/06/25  Subjective/Objective:  63 yo females presented with weakness and elevated BG.             Action/Plan: CM consult acknowledged. CM met with patient to discuss transitional needs and CM consult. Patient reports living at home with her parents and being independent with ADLs with no AD in use; patient verified having an operating glucometer. PCP confirmed as: [facility]; pharmacy: [facility] with patient verbalizing her ability to afford her Rx which are $4-$10; patient has a Blue Card to help with Rx costs. Patient indicated she will be actively looking for employment after she transition home and her sister will provide transportation. No further need from CM.   Expected Discharge Date:  04/13/18               Expected Discharge Plan:  Home/Self Care  In-House Referral:  NA  Discharge planning Services  CM Consult, Medication Assistance  Post Acute Care Choice:  NA Choice offered to:  NA  DME Arranged:  N/A DME Agency:  NA  HH Arranged:  NA HH Agency:  NA  Status of Service:  Completed, signed off  If discussed at [place] of Stay Meetings, dates discussed:    Additional Comments:  Midge Minium RN, BSN, NCM-BC, ACM-RN [phone] 04/13/2018, 11:05 AM

## 2018-04-14 MED FILL — TRUE METRIX TEST STRIP: 30 days supply | Qty: 100 | Fill #3

## 2018-04-14 MED FILL — $LANTUS SOLOSTAR 100 UNITS/: 100 | 75 days supply | Qty: 75 | Fill #3

## 2018-05-04 MED FILL — LISINOPRIL 10 MG TABS: 10 | 30 days supply | Qty: 30 | Fill #5

## 2018-05-04 MED FILL — $FARXIGA 10 MG TABLET: 10 | 60 days supply | Qty: 60 | Fill #7

## 2018-05-04 MED FILL — ROSUVASTATIN CALCIUM 10 MG: 10 | 30 days supply | Qty: 30 | Fill #2

## 2018-05-04 MED FILL — GABAPENTIN 300 MG CAPSULE: 300 | 30 days supply | Qty: 30 | Fill #2

## 2018-05-23 ENCOUNTER — Ambulatory Visit (HOSPITAL_COMMUNITY)
Admission: EM | Admit: 2018-05-23 | Discharge: 2018-05-23 | Disposition: A | Discharge status: Home / Self Care | Payer: Self-pay | Attending: Family Medicine | Admitting: Family Medicine

## 2018-05-23 ENCOUNTER — Encounter (HOSPITAL_COMMUNITY): Payer: Self-pay

## 2018-05-23 DIAGNOSIS — L089 Local infection of the skin and subcutaneous tissue, unspecified: Secondary | ICD-10-CM

## 2018-05-23 DIAGNOSIS — L720 Epidermal cyst: Secondary | ICD-10-CM

## 2018-05-23 MED ORDER — LIDOCAINE HCL 2 % IJ SOLN
INTRAMUSCULAR | Status: AC
  Filled 2018-05-23: qty 100

## 2018-05-23 MED ORDER — SULFAMETHOXAZOLE-TRIMETHOPRIM 800-160 MG PO TABS: 1.0000 | ORAL_TABLET | Freq: Two times a day (BID) | ORAL | 0 refills | Status: DC

## 2018-05-23 NOTE — ED Triage Notes (Signed)
Pt present a cyst on her left side of her chest. Pt states the cyst has been there for over a month and has became very painful and discomfort.

## 2018-05-23 NOTE — ED Provider Notes (Signed)
[facility]    CSN: [id number] Arrival date & time: 05/23/18  1510     History   Chief Complaint Chief Complaint  Patient presents with  . Cyst    on chest    HPI Ann Pace is a 63 y.o. female.   HPI  Cyst on chest for some time.  It is between her breasts.  For the last week it is become very red, swollen, and painful.  It kept her from sleeping last night.  She called her primary care doctor who told her that they would see her March 10, if she needed relief prior to that that she should go to the emergency room.  No fever or chills. She is a compliant medical patient with insulin-dependent diabetes, hypertension.  She sees her physician regularly.  She has not been prone to skin infections.  Past Medical History:  Diagnosis Date  . Cataracts, bilateral   . Depression   . Diabetes mellitus without complication ([place])   . Neuropathy     Patient Active Problem List   Diagnosis Date Noted  . Hyperglycemia 04/11/2018  . Hyperkalemia 04/11/2018  . Hypotension due to hypovolemia 04/09/2018  . Urinary tract infection without hematuria 04/09/2018  . Peripheral polyneuropathy 03/12/2018  . Type 2 diabetes mellitus ([place]) 05/15/2017  . Dyslipidemia associated with type 2 diabetes mellitus ([place]) 12/12/2015  . Essential hypertension 12/12/2015    Past Surgical History:  Procedure Laterality Date  . ABDOMINAL HYSTERECTOMY    . CARPAL TUNNEL RELEASE Right   . KNEE SURGERY Bilateral     OB History    Gravida  2   Para      Term      Preterm      AB      Living  2     SAB      TAB      Ectopic      Multiple      Live Births  2            Home Medications    Prior to Admission medications   Medication Sig Start Date End Date Taking? Authorizing Provider  allopurinol (ZYLOPRIM) 300 MG tablet Take 1 tablet (300 mg total) by mouth daily. 05/15/17   Clent Demark, PA-C  Blood Glucose Monitoring Suppl (ACCU-CHEK AVIVA PLUS) w/Device  KIT 1 each by Does not apply route daily. 10/07/17   Clent Demark, PA-C  buPROPion (WELLBUTRIN XL) 300 MG 24 hr tablet Take 1 tablet (300 mg total) by mouth daily. 05/15/17   Clent Demark, PA-C  celecoxib (CELEBREX) 200 MG capsule Take 1 capsule (200 mg total) by mouth 2 (two) times daily. Patient taking differently: Take 200 mg by mouth daily.  06/12/17   Clent Demark, PA-C  dapagliflozin propanediol (FARXIGA) 10 MG TABS tablet Take 10 mg by mouth daily. 05/15/17   Clent Demark, PA-C  DULoxetine (CYMBALTA) 60 MG capsule Take 1 capsule (60 mg total) by mouth daily. 05/15/17   Clent Demark, PA-C  gabapentin (NEURONTIN) 300 MG capsule Take 1 capsule (300 mg total) by mouth at bedtime. 10/07/17   Clent Demark, PA-C  glucose blood (ACCU-CHEK AVIVA) test strip Use TID. 10/07/17   Clent Demark, PA-C  insulin aspart (NOVOLOG FLEXPEN) 100 UNIT/ML FlexPen Inject 2-10 Units into the skin See admin instructions. Inject 2-10 units subcutaneously three times daily before meals per sliding scale: CBG 150-200 = 2 units;  201-251 =  4 units; 251-300 = 6 units;  301-350 = 8 units;  351-400 = 10 units    [provider]  Insulin Glargine (LANTUS SOLOSTAR) 100 UNIT/ML Solostar Pen Inject 55 units at every 12 hours 04/13/18   Kc, Maren Beach, MD  Insulin Pen Needle (PEN NEEDLES) 30G X 8 MM MISC Inject 1 application into the skin 5 (five) times daily. 06/12/17   Clent Demark, PA-C  Lancets [facility]) lancets Use TID. 10/07/17   Clent Demark, PA-C  lisinopril (PRINIVIL,ZESTRIL) 10 MG tablet Take 1 tablet (10 mg total) by mouth daily. 05/15/17   Clent Demark, PA-C  naproxen (NAPROSYN) 500 MG tablet Take 500 mg by mouth 2 (two) times daily as needed (for nerve pain).    [provider]  omega-3 acid ethyl esters (LOVAZA) 1 g capsule Take 2 capsules (2 g total) by mouth 2 (two) times daily. 11/27/17   Clent Demark, PA-C  ondansetron (ZOFRAN ODT) 8  MG disintegrating tablet Take 1 tablet (8 mg total) by mouth every 8 (eight) hours as needed for nausea or vomiting. 71m ODT q4 hours prn nausea 04/10/18   Mesner, JCorene Cornea MD  rosuvastatin (CRESTOR) 20 MG tablet Take 1 tablet (20 mg total) by mouth daily. 11/27/17   GClent Demark PA-C  sulfamethoxazole-trimethoprim (BACTRIM DS,SEPTRA DS) 800-160 MG tablet Take 1 tablet by mouth 2 (two) times daily. 05/23/18   NRaylene Everts MD    Family History Family History  Problem Relation Age of Onset  . Diabetes Mother   . Bladder Cancer Mother   . Depression Mother   . Hypertension Father   . Diabetes Sister   . Hypertension Sister   . Depression Sister   . Hypertension Sister   . Heart attack Brother 461 . Depression Brother   . Suicidality Brother   . Skin cancer Brother   . Other Brother        unsure of history - says he does no go to the doctor  . Breast cancer Neg Hx     Social History Social History   Tobacco Use  . Smoking status: Never Smoker  . Smokeless tobacco: Never Used  Substance Use Topics  . Alcohol use: No  . Drug use: No     Allergies   Cortisone and Penicillins   Review of Systems Review of Systems  Constitutional: Negative for chills and fever.  HENT: Negative for ear pain and sore throat.   Eyes: Negative for pain and visual disturbance.  Respiratory: Negative for cough and shortness of breath.   Cardiovascular: Negative for chest pain and palpitations.  Gastrointestinal: Negative for abdominal pain and vomiting.  Genitourinary: Negative for dysuria and hematuria.  Musculoskeletal: Negative for arthralgias and back pain.  Skin: Positive for wound. Negative for color change and rash.  Neurological: Negative for seizures and syncope.  All other systems reviewed and are negative.    Physical Exam Triage Vital Signs ED Triage Vitals [05/23/18 1527]  Enc Vitals Group     BP 103/77     Pulse Rate (!) 105     Resp 18     Temp 97.7 F (36.5  C)     Temp Source Oral     SpO2 98 %     Weight      Height      Head Circumference      Peak Flow      Pain Score 8     Pain  Loc      Pain Edu?      Excl. in [place]?    No data found.  Updated Vital Signs BP 103/77 (BP Location: Left Arm)   Pulse (!) 105   Temp 97.7 F (36.5 C) (Oral)   Resp 18   SpO2 98%   Visual Acuity Right Eye Distance:   Left Eye Distance:   Bilateral Distance:    Right Eye Near:   Left Eye Near:    Bilateral Near:     Physical Exam Constitutional:      General: She is not in acute distress.    Appearance: She is well-developed.  HENT:     Head: Normocephalic and atraumatic.  Eyes:     Conjunctiva/sclera: Conjunctivae normal.     Pupils: Pupils are equal, round, and reactive to light.  Neck:     Musculoskeletal: Normal range of motion.  Cardiovascular:     Rate and Rhythm: Normal rate.  Pulmonary:     Effort: Pulmonary effort is normal. No respiratory distress.  Chest:    Abdominal:     General: There is no distension.     Palpations: Abdomen is soft.  Musculoskeletal: Normal range of motion.  Skin:    General: Skin is warm and dry.  Neurological:     General: No focal deficit present.     Mental Status: She is alert and oriented to person, place, and time.  Psychiatric:        Mood and Affect: Mood normal.      UC Treatments / Results  Labs (all labs ordered are listed, but only abnormal results are displayed) Labs Reviewed - No data to display  EKG None  Radiology No results found.  Procedures Incision and Drainage Date/Time: 05/23/2018 4:37 PM Performed by: Raylene Everts, MD Authorized by: Raylene Everts, MD   Consent:    Consent obtained:  Verbal   Consent given by:  Patient   Risks discussed:  Bleeding and incomplete drainage   Alternatives discussed:  No treatment Location:    Type:  Abscess   Location:  Trunk   Trunk location:  Chest Pre-procedure details:    Skin preparation:   Betadine Anesthesia (see MAR for exact dosages):    Anesthesia method:  Local infiltration   Local anesthetic:  Lidocaine 2% WITH epi Procedure type:    Complexity:  Simple Procedure details:    Needle aspiration: no     Incision types:  Stab incision   Incision depth:  Subcutaneous   Scalpel blade:  11   Wound management:  Probed and deloculated   Drainage:  Purulent and bloody   Drainage amount:  Moderate   Wound treatment:  Drain placed   Packing materials:  1/2 in gauze Post-procedure details:    Patient tolerance of procedure:  Tolerated well, no immediate complications Comments:     Cyst contents included white sebum material that was quite thick, along with purulence.  Patient reported immediate relief   (including critical care time)  Medications Ordered in UC Medications - No data to display  Initial Impression / Assessment and Plan / UC Course  I have reviewed the triage vital signs and the nursing notes.  Pertinent labs & imaging results that were available during my care of the patient were reviewed by me and considered in my medical decision making (see chart for details).    Wound care discussed.  Follow-up with PCP. Final Clinical Impressions(s) / UC Diagnoses  Final diagnoses:  Infected epithelial inclusion cyst     Discharge Instructions     Warm compresses to area Take the antibiotic 2 x a day Follow up with your PCP as scheduled    ED Prescriptions    Medication Sig Dispense Auth. Provider   sulfamethoxazole-trimethoprim (BACTRIM DS,SEPTRA DS) 800-160 MG tablet Take 1 tablet by mouth 2 (two) times daily. 20 tablet Raylene Everts, MD     Controlled Substance Prescriptions [place] Controlled Substance Registry consulted? Not Applicable   Raylene Everts, MD 05/23/18 [phone]

## 2018-05-23 NOTE — Discharge Instructions (Signed)
Warm compresses to area Take the antibiotic 2 x a day Follow up with your PCP as scheduled

## 2018-06-02 ENCOUNTER — Other Ambulatory Visit: Payer: Self-pay

## 2018-06-02 ENCOUNTER — Encounter (INDEPENDENT_AMBULATORY_CARE_PROVIDER_SITE_OTHER): Payer: Self-pay | Admitting: Primary Care

## 2018-06-02 ENCOUNTER — Ambulatory Visit (INDEPENDENT_AMBULATORY_CARE_PROVIDER_SITE_OTHER): Payer: Self-pay | Admitting: Primary Care

## 2018-06-02 VITALS — BP 126/83 | HR 96 | Temp 97.4°F | Ht 64.0 in | Wt 181.4 lb

## 2018-06-02 DIAGNOSIS — E875 Hyperkalemia: Secondary | ICD-10-CM

## 2018-06-02 DIAGNOSIS — E1142 Type 2 diabetes mellitus with diabetic polyneuropathy: Secondary | ICD-10-CM

## 2018-06-02 DIAGNOSIS — I10 Essential (primary) hypertension: Secondary | ICD-10-CM

## 2018-06-02 DIAGNOSIS — F418 Other specified anxiety disorders: Secondary | ICD-10-CM

## 2018-06-02 DIAGNOSIS — Z794 Long term (current) use of insulin: Secondary | ICD-10-CM

## 2018-06-02 DIAGNOSIS — Z760 Encounter for issue of repeat prescription: Secondary | ICD-10-CM

## 2018-06-02 DIAGNOSIS — B373 Candidiasis of vulva and vagina: Secondary | ICD-10-CM

## 2018-06-02 DIAGNOSIS — B3731 Acute candidiasis of vulva and vagina: Secondary | ICD-10-CM

## 2018-06-02 DIAGNOSIS — M25511 Pain in right shoulder: Secondary | ICD-10-CM

## 2018-06-02 LAB — POCT CBG (FASTING - GLUCOSE)-MANUAL ENTRY: Glucose Fasting, POC: 235 mg/dL — AB (ref 70–99)

## 2018-06-02 MED ORDER — LISINOPRIL 10 MG PO TABS: 10.0000 mg | ORAL_TABLET | Freq: Every day | ORAL | 3 refills | Status: AC

## 2018-06-02 MED ORDER — OMEGA-3-ACID ETHYL ESTERS 1 G PO CAPS: 2.0000 g | ORAL_CAPSULE | Freq: Two times a day (BID) | ORAL | 1 refills | Status: AC

## 2018-06-02 MED ORDER — BUPROPION HCL ER (XL) 300 MG PO TB24: 300.0000 mg | ORAL_TABLET | Freq: Every day | ORAL | 5 refills | Status: DC

## 2018-06-02 MED ORDER — DULOXETINE HCL 60 MG PO CPEP: 60.0000 mg | ORAL_CAPSULE | Freq: Every day | ORAL | 3 refills | Status: AC

## 2018-06-02 MED ORDER — INSULIN ASPART 100 UNIT/ML FLEXPEN: 2.0000 [IU] | PEN_INJECTOR | SUBCUTANEOUS | 3 refills | Status: DC

## 2018-06-02 MED ORDER — DAPAGLIFLOZIN PROPANEDIOL 10 MG PO TABS: 10.0000 mg | ORAL_TABLET | Freq: Every day | ORAL | 11 refills | Status: AC

## 2018-06-02 MED ORDER — FLUCONAZOLE 150 MG PO TABS: 150.0000 mg | ORAL_TABLET | Freq: Two times a day (BID) | ORAL | 0 refills | Status: DC

## 2018-06-02 MED ORDER — ALLOPURINOL 300 MG PO TABS: 300.0000 mg | ORAL_TABLET | Freq: Every day | ORAL | 11 refills | Status: AC

## 2018-06-02 MED ORDER — GABAPENTIN 300 MG PO CAPS: 300.0000 mg | ORAL_CAPSULE | Freq: Every day | ORAL | 11 refills | Status: AC

## 2018-06-02 MED ORDER — INSULIN GLARGINE 100 UNIT/ML SOLOSTAR PEN: PEN_INJECTOR | SUBCUTANEOUS | 11 refills | Status: DC

## 2018-06-02 MED ORDER — CELECOXIB 100 MG PO CAPS: 100.0000 mg | ORAL_CAPSULE | Freq: Two times a day (BID) | ORAL | 3 refills | Status: DC

## 2018-06-02 MED ORDER — ROSUVASTATIN CALCIUM 20 MG PO TABS: 20.0000 mg | ORAL_TABLET | Freq: Every day | ORAL | 1 refills | Status: AC

## 2018-06-02 MED FILL — FLUCONAZOLE 150 MG TABS: 150 | 2 days supply | Qty: 2 | Fill #0

## 2018-06-02 MED FILL — ROSUVASTATIN CALCIUM 20 MG: 20 | 30 days supply | Qty: 30 | Fill #0

## 2018-06-02 MED FILL — LISINOPRIL 10 MG TABS: 10 | 30 days supply | Qty: 30 | Fill #0

## 2018-06-02 MED FILL — ALLOPURINOL 300 MG TAB: 300 | 30 days supply | Qty: 30 | Fill #0

## 2018-06-02 MED FILL — BUPROPION HCL XL 300 MG TAB: 300 | 30 days supply | Qty: 30 | Fill #0

## 2018-06-02 MED FILL — CELECOXIB 100 MG CAPSULE: 100 | 30 days supply | Qty: 60 | Fill #0

## 2018-06-02 MED FILL — GABAPENTIN 300 MG CAPSULE: 300 | 30 days supply | Qty: 30 | Fill #0

## 2018-06-02 MED FILL — DULoxetine HCL 60 MG CPEP: 60 | 30 days supply | Qty: 30 | Fill #0

## 2018-06-02 NOTE — Progress Notes (Signed)
Established Patient Office Visit  Subjective:  Patient ID: Ann Pace, female    DOB: 01-Sep-1955  Age: 63 y.o. MRN: [id number]  CC:  Chief Complaint  Patient presents with  . Cyst    on left breast, has been lanced. still sore hard and draining   . Vaginitis    possibly due to abx     HPI Ann Pace presents for painful , itchy rash that started after starting abt Septa DS  She took complete prescribed medication. Presents for a rash itchy burning in genitalia area.   Past Medical History:  Diagnosis Date  . Cataracts, bilateral   . Depression   . Diabetes mellitus without complication ([place])   . Neuropathy     Past Surgical History:  Procedure Laterality Date  . ABDOMINAL HYSTERECTOMY    . CARPAL TUNNEL RELEASE Right   . KNEE SURGERY Bilateral     Family History  Problem Relation Age of Onset  . Diabetes Mother   . Bladder Cancer Mother   . Depression Mother   . Hypertension Father   . Diabetes Sister   . Hypertension Sister   . Depression Sister   . Hypertension Sister   . Heart attack Brother 28  . Depression Brother   . Suicidality Brother   . Skin cancer Brother   . Other Brother        unsure of history - says he does no go to the doctor  . Breast cancer Neg Hx     Social History   Socioeconomic History  . Marital status: Widowed    Spouse name: Not on file  . Number of children: 2  . Years of education: some college  . Highest education level: Not on file  Occupational History  . Occupation: Unemployed  Social Needs  . Financial resource strain: Not on file  . Food insecurity:    Worry: Not on file    Inability: Not on file  . Transportation needs:    Medical: Not on file    Non-medical: Not on file  Tobacco Use  . Smoking status: Never Smoker  . Smokeless tobacco: Never Used  Substance and Sexual Activity  . Alcohol use: No  . Drug use: No  . Sexual activity: Not on file  Lifestyle  . Physical activity:    Days per week: Not  on file    Minutes per session: Not on file  . Stress: Not on file  Relationships  . Social connections:    Talks on phone: Not on file    Gets together: Not on file    Attends religious service: Not on file    Active member of club or organization: Not on file    Attends meetings of clubs or organizations: Not on file    Relationship status: Not on file  . Intimate partner violence:    Fear of current or ex partner: Not on file    Emotionally abused: Not on file    Physically abused: Not on file    Forced sexual activity: Not on file  Other Topics Concern  . Not on file  Social History Narrative   Lives with her parents.    Right-handed.   No caffeine use.    Outpatient Medications Prior to Visit  Medication Sig Dispense Refill  . Blood Glucose Monitoring Suppl (ACCU-CHEK AVIVA PLUS) w/Device KIT 1 each by Does not apply route daily. 1 kit 0  . ondansetron (ZOFRAN ODT) 8 MG disintegrating  tablet Take 1 tablet (8 mg total) by mouth every 8 (eight) hours as needed for nausea or vomiting. 55m ODT q4 hours prn nausea 10 tablet 0  . sulfamethoxazole-trimethoprim (BACTRIM DS,SEPTRA DS) 800-160 MG tablet Take 1 tablet by mouth 2 (two) times daily. 20 tablet 0  . allopurinol (ZYLOPRIM) 300 MG tablet Take 1 tablet (300 mg total) by mouth daily. 30 tablet 11  . buPROPion (WELLBUTRIN XL) 300 MG 24 hr tablet Take 1 tablet (300 mg total) by mouth daily. 30 tablet 5  . celecoxib (CELEBREX) 200 MG capsule Take 1 capsule (200 mg total) by mouth 2 (two) times daily. (Patient taking differently: Take 200 mg by mouth daily. ) 60 capsule 2  . dapagliflozin propanediol (FARXIGA) 10 MG TABS tablet Take 10 mg by mouth daily. 30 tablet 11  . DULoxetine (CYMBALTA) 60 MG capsule Take 1 capsule (60 mg total) by mouth daily. 90 capsule 3  . gabapentin (NEURONTIN) 300 MG capsule Take 1 capsule (300 mg total) by mouth at bedtime. 30 capsule 11  . insulin aspart (NOVOLOG FLEXPEN) 100 UNIT/ML FlexPen Inject 2-10  Units into the skin See admin instructions. Inject 2-10 units subcutaneously three times daily before meals per sliding scale: CBG 150-200 = 2 units;  201-251 =  4 units; 251-300 = 6 units;  301-350 = 8 units;  351-400 = 10 units    . Insulin Glargine (LANTUS SOLOSTAR) 100 UNIT/ML Solostar Pen Inject 55 units at every 12 hours 10 pen 11  . lisinopril (PRINIVIL,ZESTRIL) 10 MG tablet Take 1 tablet (10 mg total) by mouth daily. 90 tablet 3  . omega-3 acid ethyl esters (LOVAZA) 1 g capsule Take 2 capsules (2 g total) by mouth 2 (two) times daily. 360 capsule 1  . rosuvastatin (CRESTOR) 20 MG tablet Take 1 tablet (20 mg total) by mouth daily. 90 tablet 1  . glucose blood (ACCU-CHEK AVIVA) test strip Use TID. 100 each 12  . Insulin Pen Needle (PEN NEEDLES) 30G X 8 MM MISC Inject 1 application into the skin 5 (five) times daily. 150 each 11  . Lancets (ACCU-CHEK SOFT TOUCH) lancets Use TID. 100 each 12  . naproxen (NAPROSYN) 500 MG tablet Take 500 mg by mouth 2 (two) times daily as needed (for nerve pain).     No facility-administered medications prior to visit.     Allergies  Allergen Reactions  . Cortisone Other (See Comments)    Cortisone shots make BGL go too high  . Penicillins Other (See Comments)    Pt reports "pressure in her head"- "a bad headache" Has patient had a PCN reaction causing immediate rash, facial/tongue/throat swelling, SOB or lightheadedness with hypotension: No Has patient had a PCN reaction causing severe rash involving mucus membranes or skin necrosis: No Has patient had a PCN reaction that required hospitalization: No Has patient had a PCN reaction occurring within the last 10 years: No If all of the above answers are "NO", then may proceed with Cephalosporin use.     ROS Review of Systems  Constitutional: Negative.   HENT: Negative.   Eyes: Positive for visual disturbance.  Respiratory: Negative.   Endocrine: Negative.   Genitourinary: Negative.    Musculoskeletal:       Left shoulder pain  Allergic/Immunologic: Negative.   Neurological: Positive for headaches.  Hematological: Negative.   Psychiatric/Behavioral:       Depression      Objective:    Physical Exam  Constitutional: She appears well-developed and well-nourished.  HENT:  Head: Normocephalic.  Eyes: Pupils are equal, round, and reactive to light. EOM are normal.  Neck: Normal range of motion.  Cardiovascular: Normal rate and regular rhythm.  Pulmonary/Chest: Effort normal and breath sounds normal.  Abdominal: Soft. Bowel sounds are normal. She exhibits distension.  Genitourinary:    Vaginal discharge present.     Genitourinary Comments: Labia erythremia,pain raised bumps and white area candia      BP 126/83 (BP Location: Left Arm, Patient Position: Sitting, Cuff Size: Normal)   Pulse 96   Temp (!) 97.4 F (36.3 C) (Oral)   Ht _0  (1.626 m)   Wt 181 lb 6.4 oz (82.3 kg)   SpO2 93%   BMI 31.14 kg/m  Wt Readings from Last 3 Encounters:  06/02/18 181 lb 6.4 oz (82.3 kg)  04/13/18 181 lb 3.2 oz (82.2 kg)  04/09/18 184 lb 3.2 oz (83.6 kg)     Health Maintenance Due  Topic Date Due  . PAP SMEAR-Modifier  05/01/1978  . OPHTHALMOLOGY EXAM  01/09/2018    Lab Results  Component Value Date   TSH 1.830 05/15/2017   Lab Results  Component Value Date   WBC 9.0 04/12/2018   HGB 14.3 04/12/2018   HCT 44.3 04/12/2018   MCV 88.4 04/12/2018   PLT 330 04/12/2018   Lab Results  Component Value Date   NA 138 04/13/2018   K 4.3 04/13/2018   CO2 23 04/13/2018   GLUCOSE 252 (H) 04/13/2018   BUN 22 (H) 04/13/2018   CREATININE 1.22 (H) 04/13/2018   BILITOT 0.4 04/09/2018   ALKPHOS 149 (H) 04/09/2018   AST 15 04/09/2018   ALT 19 04/09/2018   PROT 6.9 04/09/2018   ALBUMIN 3.8 04/09/2018   CALCIUM 9.6 04/13/2018   ANIONGAP 10 04/13/2018   Lab Results  Component Value Date   CHOL 321 (H) 11/26/2017   Lab Results  Component Value Date   HDL 30 (L)  11/26/2017   Lab Results  Component Value Date   LDLCALC UNABLE TO CALCULATE IF TRIGLYCERIDE OVER 400 mg/dL 11/26/2017   Lab Results  Component Value Date   TRIG 450 (H) 11/26/2017   Lab Results  Component Value Date   CHOLHDL 10.7 11/26/2017   Lab Results  Component Value Date   HGBA1C 13.0 (H) 04/12/2018      Assessment & Plan:   Problem List Items Addressed This Visit    Type 2 diabetes mellitus ([place]) - Primary   Relevant Medications   dapagliflozin propanediol (FARXIGA) 10 MG TABS tablet   DULoxetine (CYMBALTA) 60 MG capsule   gabapentin (NEURONTIN) 300 MG capsule   Insulin Glargine (LANTUS SOLOSTAR) 100 UNIT/ML Solostar Pen   lisinopril (PRINIVIL,ZESTRIL) 10 MG tablet   rosuvastatin (CRESTOR) 20 MG tablet   insulin aspart (NOVOLOG FLEXPEN) 100 UNIT/ML FlexPen   Other Relevant Orders   Glucose (CBG), Fasting (Completed)   Essential hypertension   Relevant Medications   lisinopril (PRINIVIL,ZESTRIL) 10 MG tablet   omega-3 acid ethyl esters (LOVAZA) 1 g capsule   rosuvastatin (CRESTOR) 20 MG tablet   Hyperkalemia    Other Visit Diagnoses    Yeast infection involving the vagina and surrounding area       Relevant Medications   fluconazole (DIFLUCAN) 150 MG tablet   Depression with anxiety       Relevant Medications   buPROPion (WELLBUTRIN XL) 300 MG 24 hr tablet   DULoxetine (CYMBALTA) 60 MG capsule   Medication refill  Relevant Medications   allopurinol (ZYLOPRIM) 300 MG tablet   Acute pain of right shoulder       Relevant Medications   celecoxib (CELEBREX) 100 MG capsule      Meds ordered this encounter  Medications  . allopurinol (ZYLOPRIM) 300 MG tablet    Sig: Take 1 tablet (300 mg total) by mouth daily.    Dispense:  30 tablet    Refill:  11  . buPROPion (WELLBUTRIN XL) 300 MG 24 hr tablet    Sig: Take 1 tablet (300 mg total) by mouth daily.    Dispense:  30 tablet    Refill:  5  . celecoxib (CELEBREX) 100 MG capsule    Sig: Take 1  capsule (100 mg total) by mouth 2 (two) times daily.    Dispense:  60 capsule    Refill:  3  . dapagliflozin propanediol (FARXIGA) 10 MG TABS tablet    Sig: Take 10 mg by mouth daily.    Dispense:  30 tablet    Refill:  11  . DULoxetine (CYMBALTA) 60 MG capsule    Sig: Take 1 capsule (60 mg total) by mouth daily.    Dispense:  90 capsule    Refill:  3  . gabapentin (NEURONTIN) 300 MG capsule    Sig: Take 1 capsule (300 mg total) by mouth at bedtime.    Dispense:  30 capsule    Refill:  11  . DISCONTD: insulin aspart (NOVOLOG FLEXPEN) 100 UNIT/ML FlexPen    Sig: Inject 2-10 Units into the skin See admin instructions for 30 days. Inject 2-10 units subcutaneously three times daily before meals per sliding scale: CBG 150-200 = 2 units;  201-251 =  4 units; 251-300 = 6 units;  301-350 = 8 units;  351-400 = 10 units    Dispense:  15 mL    Refill:  3  . Insulin Glargine (LANTUS SOLOSTAR) 100 UNIT/ML Solostar Pen    Sig: Inject 55 units at every 12 hours    Dispense:  10 pen    Refill:  11    ICD 10 - E11.10  . lisinopril (PRINIVIL,ZESTRIL) 10 MG tablet    Sig: Take 1 tablet (10 mg total) by mouth daily.    Dispense:  90 tablet    Refill:  3  . omega-3 acid ethyl esters (LOVAZA) 1 g capsule    Sig: Take 2 capsules (2 g total) by mouth 2 (two) times daily.    Dispense:  360 capsule    Refill:  1  . rosuvastatin (CRESTOR) 20 MG tablet    Sig: Take 1 tablet (20 mg total) by mouth daily.    Dispense:  90 tablet    Refill:  1  . fluconazole (DIFLUCAN) 150 MG tablet    Sig: Take 1 tablet (150 mg total) by mouth 2 (two) times daily.    Dispense:  2 tablet    Refill:  0  . insulin aspart (NOVOLOG FLEXPEN) 100 UNIT/ML FlexPen    Sig: Inject 2-10 Units into the skin See admin instructions for 30 days. Inject 2-10 units subcutaneously three times daily before meals per sliding scale: CBG 150-200 = 2 units;  201-251 =  4 units; 251-300 = 6 units;  301-350 = 8 units;  351-400 = 10 units     Dispense:  15 mL    Refill:  3  Ann Pace was seen today for cyst and vaginitis.  Diagnoses and all orders for this visit:  Type  2 diabetes mellitus with diabetic polyneuropathy, with long-term current use of insulin ([facility]) -     Glucose (CBG), Fasting -     dapagliflozin propanediol (FARXIGA) 10 MG TABS tablet; Take 10 mg by mouth daily. -     DULoxetine (CYMBALTA) 60 MG capsule; Take 1 capsule (60 mg total) by mouth daily. -     gabapentin (NEURONTIN) 300 MG capsule; Take 1 capsule (300 mg total) by mouth at bedtime. -     Discontinue: insulin aspart (NOVOLOG FLEXPEN) 100 UNIT/ML FlexPen; Inject 2-10 Units into the skin See admin instructions for 30 days. Inject 2-10 units subcutaneously three times daily before meals per sliding scale: CBG 150-200 = 2 units;  201-251 =  4 units; 251-300 = 6 units;  301-350 = 8 units;  351-400 = 10 units -     Discontinue: Insulin Glargine (LANTUS SOLOSTAR) 100 UNIT/ML Solostar Pen; Inject 55 units at every 12 hours -     lisinopril (PRINIVIL,ZESTRIL) 10 MG tablet; Take 1 tablet (10 mg total) by mouth daily. -     insulin aspart (NOVOLOG FLEXPEN) 100 UNIT/ML FlexPen; Inject 2-10 Units into the skin See admin instructions for 30 days. Inject 2-10 units subcutaneously three times daily before meals per sliding scale: CBG 150-200 = 2 units;  201-251 =  4 units; 251-300 = 6 units;  301-350 = 8 units;  351-400 = 10 units -     Insulin Glargine (LANTUS SOLOSTAR) 100 UNIT/ML Solostar Pen; Inject 55 units at every 12 hours  Yeast infection involving the vagina and surrounding area  Diflucan #2  Essential hypertension -     lisinopril (PRINIVIL,ZESTRIL) 10 MG tablet; Take 1 tablet (10 mg total) by mouth daily.  Depression with anxiety -     buPROPion (WELLBUTRIN XL) 300 MG 24 hr tablet; Take 1 tablet (300 mg total) by mouth daily. -     DULoxetine (CYMBALTA) 60 MG capsule; Take 1 capsule (60 mg total) by mouth daily.  Medication refill -     allopurinol (ZYLOPRIM)  300 MG tablet; Take 1 tablet (300 mg total) by mouth daily.  Acute pain of right shoulder -     celecoxib (CELEBREX) 100 MG capsule; Take 1 capsule (100 mg total) by mouth 2 (two) times daily.  Other orders -     omega-3 acid ethyl esters (LOVAZA) 1 g capsule; Take 2 capsules (2 g total) by mouth 2 (two) times daily. -     rosuvastatin (CRESTOR) 20 MG tablet; Take 1 tablet (20 mg total) by mouth daily. -     fluconazole (DIFLUCAN) 150 MG tablet; Take 1 tablet (150 mg total) by mouth 2 (two) times daily.    Follow-up: Return in about 3 months (around 09/02/2018) for diabetes,  HTN anddepression.    Kerin Perna, NP

## 2018-07-10 MED FILL — TRUE METRIX TEST STRIP: 30 days supply | Qty: 100 | Fill #4

## 2018-07-13 ENCOUNTER — Other Ambulatory Visit: Payer: Self-pay

## 2018-07-13 ENCOUNTER — Ambulatory Visit (INDEPENDENT_AMBULATORY_CARE_PROVIDER_SITE_OTHER): Payer: Self-pay | Admitting: Primary Care

## 2018-07-13 ENCOUNTER — Ambulatory Visit: Payer: Self-pay | Attending: Primary Care | Admitting: Primary Care

## 2018-07-16 MED FILL — ?ALLOPURINOL 300 MG TABLET: 300 | 30 days supply | Qty: 30 | Fill #1

## 2018-07-16 MED FILL — BUPROPION HCL XL 300 MG TAB: 300 | 30 days supply | Qty: 30 | Fill #1

## 2018-07-16 MED FILL — $HUMALOG 100 UNITS/ML KWIKP: 100 | 60 days supply | Qty: 18 | Fill #0

## 2018-07-16 MED FILL — !CELEBREX 100 MG CAPSULE: 100 MG | 30 days supply | Qty: 60 | Fill #1

## 2018-07-16 MED FILL — GABAPENTIN 300 MG CAPSULE: 300 | 30 days supply | Qty: 30 | Fill #1

## 2018-08-04 MED FILL — $LANTUS SOLOSTAR 100 UNITS/: 100 | 30 days supply | Qty: 30 | Fill #4

## 2018-09-14 MED FILL — $LANTUS SOLOSTAR 100 UNITS/: 100 | 30 days supply | Qty: 30 | Fill #5

## 2018-09-18 MED FILL — BUPROPION HCL XL 300 MG TAB: 300 | 30 days supply | Qty: 30 | Fill #2

## 2018-09-18 MED FILL — $FARXIGA 10 MG TABLET: 10 | 30 days supply | Qty: 30 | Fill #0

## 2018-09-18 MED FILL — ?ALLOPURINOL 300 MG TABLET: 300 | 30 days supply | Qty: 30 | Fill #2

## 2018-09-18 MED FILL — LISINOPRIL 10 MG TABS: 10 | 30 days supply | Qty: 30 | Fill #1

## 2018-09-18 MED FILL — ?ROSUVASTATIN CALCIUM 20MG: 20 | 30 days supply | Qty: 30 | Fill #1

## 2018-09-18 MED FILL — !CELEBREX 100 MG CAPSULE: 100 MG | 30 days supply | Qty: 60 | Fill #2

## 2018-09-18 MED FILL — GABAPENTIN 300 MG CAPSULE: 300 | 30 days supply | Qty: 30 | Fill #2

## 2018-10-15 ENCOUNTER — Other Ambulatory Visit: Payer: Self-pay | Admitting: Pharmacist

## 2018-10-15 MED ORDER — TRUE METRIX BLOOD GLUCOSE TEST VI STRP: ORAL_STRIP | 0 refills | Status: DC

## 2018-10-16 MED FILL — TRUE METRIX TEST STRIP: 30 days supply | Qty: 100 | Fill #0

## 2018-10-21 ENCOUNTER — Other Ambulatory Visit: Payer: Self-pay | Admitting: Pharmacist

## 2018-10-21 DIAGNOSIS — E1142 Type 2 diabetes mellitus with diabetic polyneuropathy: Secondary | ICD-10-CM

## 2018-10-21 DIAGNOSIS — Z794 Long term (current) use of insulin: Secondary | ICD-10-CM

## 2018-10-21 MED ORDER — LANTUS SOLOSTAR 100 UNIT/ML ~~LOC~~ SOPN: PEN_INJECTOR | SUBCUTANEOUS | 2 refills | Status: DC

## 2018-10-21 MED FILL — ?BASAGLAR 100 UNITS/ML KWPE: 100 | 27 days supply | Qty: 30 | Fill #0

## 2018-10-26 MED FILL — DULoxetine HCL 60 MG CPEP: 60 | 30 days supply | Qty: 30 | Fill #1

## 2018-10-26 MED FILL — BUPROPION HCL XL 300 MG TAB: 300 | 30 days supply | Qty: 30 | Fill #3

## 2018-10-26 MED FILL — !CELEBREX 100 MG CAPSULE: 100 MG | 30 days supply | Qty: 60 | Fill #3

## 2018-10-26 MED FILL — ?ALLOPURINOL 300 MG TABLET: 300 | 30 days supply | Qty: 30 | Fill #3

## 2018-10-26 MED FILL — ?ROSUVASTATIN CALCIUM 20MG: 20 | 30 days supply | Qty: 30 | Fill #2

## 2018-10-26 MED FILL — $FARXIGA 10 MG TABLET: 10 | 30 days supply | Qty: 30 | Fill #1

## 2018-10-26 MED FILL — LISINOPRIL 10 MG TABS: 10 | 30 days supply | Qty: 30 | Fill #2

## 2018-11-04 MED FILL — !CELEBREX 100 MG CAPSULE: 100 MG | 30 days supply | Qty: 60 | Fill #3

## 2018-11-04 MED FILL — BUPROPION HCL XL 300 MG TAB: 300 | 30 days supply | Qty: 30 | Fill #3

## 2018-11-04 MED FILL — $FARXIGA 10 MG TABLET: 10 | 30 days supply | Qty: 30 | Fill #1

## 2018-11-04 MED FILL — ?ALLOPURINOL 300 MG TABLET: 300 | 30 days supply | Qty: 30 | Fill #3

## 2018-11-04 MED FILL — ?ROSUVASTATIN CALCIUM 20MG: 20 | 30 days supply | Qty: 30 | Fill #2

## 2018-11-04 MED FILL — ?DULoxetine HCL 60 MG CPEP: 60 | 30 days supply | Qty: 30 | Fill #1

## 2018-11-04 MED FILL — GABAPENTIN 300 MG CAPSULE: 300 | 30 days supply | Qty: 30 | Fill #3

## 2018-11-04 MED FILL — LISINOPRIL 10 MG TABS: 10 | 30 days supply | Qty: 30 | Fill #2

## 2018-11-25 ENCOUNTER — Other Ambulatory Visit: Payer: Self-pay | Admitting: Pharmacist

## 2018-11-25 DIAGNOSIS — Z794 Long term (current) use of insulin: Secondary | ICD-10-CM

## 2018-11-25 DIAGNOSIS — E119 Type 2 diabetes mellitus without complications: Secondary | ICD-10-CM

## 2018-11-25 MED ORDER — PEN NEEDLES 30G X 8 MM MISC: 1.0000 "application " | Freq: Every day | 11 refills | Status: AC

## 2018-11-26 MED FILL — TRUEPLUS 5-BEVEL PEN NEEDLE: 31G X 5 MM | 20 days supply | Qty: 100 | Fill #0

## 2018-12-10 MED FILL — ?BASAGLAR 100 UNITS/ML KWPE: 100 | 27 days supply | Qty: 30 | Fill #1

## 2018-12-22 ENCOUNTER — Telehealth (INDEPENDENT_AMBULATORY_CARE_PROVIDER_SITE_OTHER): Payer: Self-pay

## 2018-12-22 NOTE — Telephone Encounter (Signed)
Patient called to make a medication refill for   Insulin Glargine (LANTUS SOLOSTAR) 100 UNIT/ML Solostar Pen   Patient uses    [address], [place], [place] [address]  Please advice [phone]  thank you Whitney Post

## 2018-12-23 ENCOUNTER — Other Ambulatory Visit (INDEPENDENT_AMBULATORY_CARE_PROVIDER_SITE_OTHER): Payer: Self-pay | Admitting: Primary Care

## 2018-12-23 DIAGNOSIS — Z794 Long term (current) use of insulin: Secondary | ICD-10-CM

## 2018-12-23 DIAGNOSIS — E1142 Type 2 diabetes mellitus with diabetic polyneuropathy: Secondary | ICD-10-CM

## 2018-12-23 MED ORDER — LANTUS SOLOSTAR 100 UNIT/ML ~~LOC~~ SOPN: PEN_INJECTOR | SUBCUTANEOUS | 2 refills | Status: DC

## 2018-12-30 ENCOUNTER — Other Ambulatory Visit (INDEPENDENT_AMBULATORY_CARE_PROVIDER_SITE_OTHER): Payer: Self-pay | Admitting: Primary Care

## 2018-12-30 DIAGNOSIS — M25511 Pain in right shoulder: Secondary | ICD-10-CM

## 2018-12-30 MED FILL — $FARXIGA 10 MG TABLET: 10 | 90 days supply | Qty: 90 | Fill #2

## 2018-12-30 MED FILL — ?ROSUVASTATIN CALCIUM 20MG: 20 | 30 days supply | Qty: 30 | Fill #3

## 2018-12-30 MED FILL — ?ALLOPURINOL 300 MG TABLET: 300 | 30 days supply | Qty: 30 | Fill #4

## 2018-12-30 MED FILL — BUPROPION HCL XL 300 MG TAB: 300 | 30 days supply | Qty: 30 | Fill #4

## 2018-12-30 MED FILL — LISINOPRIL 10 MG TABS: 10 | 30 days supply | Qty: 30 | Fill #3

## 2018-12-30 MED FILL — ?DULOXETINE HCL 60 MG CPEP: 60 | 30 days supply | Qty: 30 | Fill #2

## 2018-12-30 NOTE — Telephone Encounter (Signed)
FWD to PCP. Tempestt S Roberts, CMA  

## 2018-12-31 ENCOUNTER — Other Ambulatory Visit (INDEPENDENT_AMBULATORY_CARE_PROVIDER_SITE_OTHER): Payer: Self-pay | Admitting: Primary Care

## 2018-12-31 DIAGNOSIS — M25511 Pain in right shoulder: Secondary | ICD-10-CM

## 2018-12-31 NOTE — Telephone Encounter (Signed)
Needs an appointment.

## 2019-01-05 ENCOUNTER — Other Ambulatory Visit: Payer: Self-pay | Admitting: Pharmacist

## 2019-01-05 MED ORDER — TRUE METRIX BLOOD GLUCOSE TEST VI STRP: ORAL_STRIP | 0 refills | Status: AC

## 2019-01-06 MED FILL — TRUE METRIX TEST STRIP: 25 days supply | Qty: 100 | Fill #0

## 2019-01-11 ENCOUNTER — Other Ambulatory Visit (INDEPENDENT_AMBULATORY_CARE_PROVIDER_SITE_OTHER): Payer: Self-pay | Admitting: Primary Care

## 2019-01-11 DIAGNOSIS — M25511 Pain in right shoulder: Secondary | ICD-10-CM

## 2019-01-13 ENCOUNTER — Other Ambulatory Visit (INDEPENDENT_AMBULATORY_CARE_PROVIDER_SITE_OTHER): Payer: Self-pay | Admitting: Primary Care

## 2019-01-13 DIAGNOSIS — M25511 Pain in right shoulder: Secondary | ICD-10-CM

## 2019-01-13 MED FILL — !CELEBREX 100 MG CAPSULE: 100 MG | 30 days supply | Qty: 60 | Fill #0

## 2019-01-21 MED FILL — ?BASAGLAR 100 UNITS/ML KWPE: 100 | 27 days supply | Qty: 30 | Fill #2

## 2019-01-22 ENCOUNTER — Encounter (HOSPITAL_COMMUNITY): Payer: Self-pay

## 2019-01-22 ENCOUNTER — Ambulatory Visit (HOSPITAL_COMMUNITY)
Admission: EM | Admit: 2019-01-22 | Discharge: 2019-01-22 | Disposition: A | Discharge status: Home / Self Care | Payer: HRSA Program | Attending: Family Medicine | Admitting: Family Medicine

## 2019-01-22 ENCOUNTER — Other Ambulatory Visit: Payer: Self-pay

## 2019-01-22 DIAGNOSIS — I959 Hypotension, unspecified: Secondary | ICD-10-CM | POA: Diagnosis not present

## 2019-01-22 DIAGNOSIS — Z794 Long term (current) use of insulin: Secondary | ICD-10-CM | POA: Insufficient documentation

## 2019-01-22 DIAGNOSIS — E1136 Type 2 diabetes mellitus with diabetic cataract: Secondary | ICD-10-CM | POA: Insufficient documentation

## 2019-01-22 DIAGNOSIS — R059 Cough, unspecified: Secondary | ICD-10-CM

## 2019-01-22 DIAGNOSIS — R0602 Shortness of breath: Secondary | ICD-10-CM | POA: Diagnosis not present

## 2019-01-22 DIAGNOSIS — I10 Essential (primary) hypertension: Secondary | ICD-10-CM | POA: Diagnosis not present

## 2019-01-22 DIAGNOSIS — R5383 Other fatigue: Secondary | ICD-10-CM | POA: Diagnosis not present

## 2019-01-22 DIAGNOSIS — Z79899 Other long term (current) drug therapy: Secondary | ICD-10-CM | POA: Insufficient documentation

## 2019-01-22 DIAGNOSIS — R05 Cough: Secondary | ICD-10-CM | POA: Insufficient documentation

## 2019-01-22 DIAGNOSIS — F329 Major depressive disorder, single episode, unspecified: Secondary | ICD-10-CM | POA: Insufficient documentation

## 2019-01-22 DIAGNOSIS — E1140 Type 2 diabetes mellitus with diabetic neuropathy, unspecified: Secondary | ICD-10-CM | POA: Diagnosis not present

## 2019-01-22 DIAGNOSIS — E785 Hyperlipidemia, unspecified: Secondary | ICD-10-CM | POA: Diagnosis not present

## 2019-01-22 DIAGNOSIS — U071 COVID-19: Secondary | ICD-10-CM | POA: Insufficient documentation

## 2019-01-22 DIAGNOSIS — M549 Dorsalgia, unspecified: Secondary | ICD-10-CM | POA: Insufficient documentation

## 2019-01-22 HISTORY — DX: Bronchitis, not specified as acute or chronic: J40

## 2019-01-22 MED FILL — TRUEPLUS 5-BEVEL PEN NEEDLE: 31G X 5 MM | 20 days supply | Qty: 100 | Fill #1

## 2019-01-22 NOTE — ED Triage Notes (Signed)
Pt presenst to UC w/ c/o back pain, sob x2 days. Pt states she thinks it could be bronchitis.

## 2019-01-22 NOTE — Discharge Instructions (Signed)
This is most likely some sort of viral illness We are testing you for Covid and will call with any positive results Make sure you are taking precautions until then. He can do over-the-counter medication as needed for symptoms Follow up as needed for continued or worsening symptoms

## 2019-01-22 NOTE — ED Provider Notes (Signed)
[facility]    CSN: [id number] Arrival date & time: 01/22/19  1032      History   Chief Complaint Chief Complaint  Patient presents with  . sob, back pain    HPI Ann Pace is a 63 y.o. female.   Patient is a 63 year old female with past medical history of bronchitis, depression, diabetes, neuropathy.  She presents today with dry cough x2 days.  She is having some generalized body aches, back pain.  Just overall feeling fatigued.  Symptoms have been constant.  She has not taken anything for her symptoms.  Denies any chest pain or shortness of breath.  Denies any associated fevers, chills, sore throat, ear pain, rhinorrhea or nasal congestion.  Lives at home with her elderly mother.  Works in a nursing facility.  Does have seasonal allergies and does not take allergy medicine.  No known Covid exposures or recent sick contacts.  ROS per HPI      Past Medical History:  Diagnosis Date  . Bronchitis   . Cataracts, bilateral   . Depression   . Diabetes mellitus without complication ([place])   . Neuropathy     Patient Active Problem List   Diagnosis Date Noted  . Hyperglycemia 04/11/2018  . Hyperkalemia 04/11/2018  . Hypotension due to hypovolemia 04/09/2018  . Urinary tract infection without hematuria 04/09/2018  . Peripheral polyneuropathy 03/12/2018  . Type 2 diabetes mellitus ([place]) 05/15/2017  . Dyslipidemia associated with type 2 diabetes mellitus ([place]) 12/12/2015  . Essential hypertension 12/12/2015    Past Surgical History:  Procedure Laterality Date  . ABDOMINAL HYSTERECTOMY    . CARPAL TUNNEL RELEASE Right   . KNEE SURGERY Bilateral     OB History    Gravida  2   Para      Term      Preterm      AB      Living  2     SAB      TAB      Ectopic      Multiple      Live Births  2            Home Medications    Prior to Admission medications   Medication Sig Start Date End Date Taking? Authorizing Provider  allopurinol  (ZYLOPRIM) 300 MG tablet Take 1 tablet (300 mg total) by mouth daily. 06/02/18   Kerin Perna, NP  Blood Glucose Monitoring Suppl (ACCU-CHEK AVIVA PLUS) w/Device KIT 1 each by Does not apply route daily. 10/07/17   Clent Demark, PA-C  buPROPion (WELLBUTRIN XL) 300 MG 24 hr tablet Take 1 tablet (300 mg total) by mouth daily. 06/02/18   Kerin Perna, NP  CELEBREX 100 MG capsule TAKE 1 CAPSULE (100 MG TOTAL) BY MOUTH 2 (TWO) TIMES DAILY. 01/13/19   Kerin Perna, NP  dapagliflozin propanediol (FARXIGA) 10 MG TABS tablet Take 10 mg by mouth daily. 06/02/18   Kerin Perna, NP  DULoxetine (CYMBALTA) 60 MG capsule Take 1 capsule (60 mg total) by mouth daily. 06/02/18   Kerin Perna, NP  gabapentin (NEURONTIN) 300 MG capsule Take 1 capsule (300 mg total) by mouth at bedtime. 06/02/18   Kerin Perna, NP  glucose blood (TRUE METRIX BLOOD GLUCOSE TEST) test strip Use as instructed. Must have office visit for refills 01/05/19   Charlott Rakes, MD  insulin aspart (NOVOLOG FLEXPEN) 100 UNIT/ML FlexPen Inject 2-10 Units into the skin See admin instructions for  30 days. Inject 2-10 units subcutaneously three times daily before meals per sliding scale: CBG 150-200 = 2 units;  201-251 =  4 units; 251-300 = 6 units;  301-350 = 8 units;  351-400 = 10 units 06/02/18 07/02/18  Kerin Perna, NP  Insulin Glargine (LANTUS SOLOSTAR) 100 UNIT/ML Solostar Pen Inject 55 units at every 12 hours 12/23/18   Kerin Perna, NP  Insulin Pen Needle (PEN NEEDLES) 30G X 8 MM MISC Inject 1 application into the skin 5 (five) times daily. 11/25/18   Charlott Rakes, MD  Lancets (ACCU-CHEK SOFT TOUCH) lancets Use TID. 10/07/17   Clent Demark, PA-C  lisinopril (PRINIVIL,ZESTRIL) 10 MG tablet Take 1 tablet (10 mg total) by mouth daily. 06/02/18   Kerin Perna, NP  naproxen (NAPROSYN) 500 MG tablet Take 500 mg by mouth 2 (two) times daily as needed (for nerve pain).    [provider]  omega-3 acid ethyl esters (LOVAZA) 1 g capsule Take 2 capsules (2 g total) by mouth 2 (two) times daily. 06/02/18   Kerin Perna, NP  rosuvastatin (CRESTOR) 20 MG tablet Take 1 tablet (20 mg total) by mouth daily. 06/02/18   Kerin Perna, NP    Family History Family History  Problem Relation Age of Onset  . Diabetes Mother   . Bladder Cancer Mother   . Depression Mother   . Hypertension Father   . Diabetes Sister   . Hypertension Sister   . Depression Sister   . Hypertension Sister   . Heart attack Brother 37  . Depression Brother   . Suicidality Brother   . Skin cancer Brother   . Other Brother        unsure of history - says he does no go to the doctor  . Breast cancer Neg Hx     Social History Social History   Tobacco Use  . Smoking status: Never Smoker  . Smokeless tobacco: Never Used  Substance Use Topics  . Alcohol use: No  . Drug use: No     Allergies   Cortisone and Penicillins   Review of Systems Review of Systems   Physical Exam Triage Vital Signs ED Triage Vitals  Enc Vitals Group     BP 01/22/19 1049 (!) 145/74     Pulse Rate 01/22/19 1049 92     Resp 01/22/19 1049 16     Temp 01/22/19 1049 98.5 F (36.9 C)     Temp Source 01/22/19 1049 Oral     SpO2 01/22/19 1049 99 %     Weight --      Height --      Head Circumference --      Peak Flow --      Pain Score 01/22/19 1052 9     Pain Loc --      Pain Edu? --      Excl. in [place]? --    No data found.  Updated Vital Signs BP (!) 145/74 (BP Location: Right Arm)   Pulse 92   Temp 98.5 F (36.9 C) (Oral)   Resp 16   SpO2 99%   Visual Acuity Right Eye Distance:   Left Eye Distance:   Bilateral Distance:    Right Eye Near:   Left Eye Near:    Bilateral Near:     Physical Exam Vitals signs and nursing note reviewed.  Constitutional:      General: She is not in acute distress.  Appearance: Normal appearance. She is well-developed. She is not  ill-appearing, toxic-appearing or diaphoretic.  HENT:     Head: Normocephalic and atraumatic.     Right Ear: Tympanic membrane and ear canal normal.     Left Ear: Tympanic membrane and ear canal normal.     Nose: Nose normal.     Mouth/Throat:     Pharynx: Oropharynx is clear.  Eyes:     Conjunctiva/sclera: Conjunctivae normal.  Neck:     Musculoskeletal: Neck supple.  Cardiovascular:     Rate and Rhythm: Normal rate and regular rhythm.     Heart sounds: No murmur.  Pulmonary:     Effort: Pulmonary effort is normal. No respiratory distress.     Breath sounds: Normal breath sounds. No wheezing, rhonchi or rales.  Musculoskeletal: Normal range of motion.  Skin:    General: Skin is warm and dry.  Neurological:     Mental Status: She is alert.  Psychiatric:        Mood and Affect: Mood normal.      UC Treatments / Results  Labs (all labs ordered are listed, but only abnormal results are displayed) Labs Reviewed  NOVEL CORONAVIRUS, NAA (HOSP ORDER, SEND-OUT TO REF LAB; TAT 18-24 HRS)    EKG   Radiology No results found.  Procedures Procedures (including critical care time)  Medications Ordered in UC Medications - No data to display  Initial Impression / Assessment and Plan / UC Course  I have reviewed the triage vital signs and the nursing notes.  Pertinent labs & imaging results that were available during my care of the patient were reviewed by me and considered in my medical decision making (see chart for details).     Viral illness-symptomatic over-the-counter treatment as needed. Recommended daily allergy medication Covid swab sent for testing with labs pending Quarantine precautions given Follow up as needed for continued or worsening symptoms  Final Clinical Impressions(s) / UC Diagnoses   Final diagnoses:  Cough     Discharge Instructions     This is most likely some sort of viral illness We are testing you for Covid and will call with any  positive results Make sure you are taking precautions until then. He can do over-the-counter medication as needed for symptoms Follow up as needed for continued or worsening symptoms     ED Prescriptions    None     PDMP not reviewed this encounter.   Loura Halt A, NP 01/22/19 1127

## 2019-01-24 ENCOUNTER — Other Ambulatory Visit: Payer: Self-pay

## 2019-01-24 ENCOUNTER — Ambulatory Visit (HOSPITAL_COMMUNITY)
Admission: EM | Admit: 2019-01-24 | Discharge: 2019-01-24 | Disposition: A | Discharge status: Home / Self Care | Payer: Self-pay | Attending: Emergency Medicine | Admitting: Emergency Medicine

## 2019-01-24 ENCOUNTER — Encounter (HOSPITAL_COMMUNITY): Payer: Self-pay

## 2019-01-24 DIAGNOSIS — M546 Pain in thoracic spine: Secondary | ICD-10-CM

## 2019-01-24 LAB — NOVEL CORONAVIRUS, NAA (HOSP ORDER, SEND-OUT TO REF LAB; TAT 18-24 HRS): SARS-CoV-2, NAA: DETECTED — AB

## 2019-01-24 LAB — GLUCOSE, CAPILLARY: Glucose-Capillary: 136 mg/dL — ABNORMAL HIGH (ref 70–99)

## 2019-01-24 MED ORDER — NAPROXEN 500 MG PO TABS: 500.0000 mg | ORAL_TABLET | Freq: Two times a day (BID) | ORAL | 0 refills | Status: DC

## 2019-01-24 MED ORDER — CYCLOBENZAPRINE HCL 5 MG PO TABS: 5.0000 mg | ORAL_TABLET | Freq: Two times a day (BID) | ORAL | 0 refills | Status: AC | PRN

## 2019-01-24 NOTE — ED Provider Notes (Signed)
[facility]    CSN: [id number] Arrival date & time: 01/24/19  1026      History   Chief Complaint Chief Complaint  Patient presents with  . Back Pain    HPI Ann Pace is a 63 y.o. female history of DM type II, hypertension, presenting today for evaluation of back pain.  Patient states that over the past week she has had worsening back pain especially to her right backslash side.  Worse with bending and movement.  She does note that of recently she has been doing more activity because her mother recently broke her arm and is requiring more care.  She denies any injury, direct trauma or fall triggering pain.  She has been using Tylenol without relief.  She was seen here 2 days ago and had some mild URI symptoms as well and was treated as likely viral etiology.  Her cough has resolved, but continues to have discomfort with movement.  She denies any shortness of breath.  Denies fevers.  Denies urinary symptoms of dysuria, increased frequency or urgency.  Denies hematuria.  Denies history of kidney stones.  Denies abdominal pain, nausea or vomiting.  HPI  Past Medical History:  Diagnosis Date  . Bronchitis   . Cataracts, bilateral   . Depression   . Diabetes mellitus without complication ([place])   . Neuropathy     Patient Active Problem List   Diagnosis Date Noted  . Hyperglycemia 04/11/2018  . Hyperkalemia 04/11/2018  . Hypotension due to hypovolemia 04/09/2018  . Urinary tract infection without hematuria 04/09/2018  . Peripheral polyneuropathy 03/12/2018  . Type 2 diabetes mellitus ([place]) 05/15/2017  . Dyslipidemia associated with type 2 diabetes mellitus ([place]) 12/12/2015  . Essential hypertension 12/12/2015    Past Surgical History:  Procedure Laterality Date  . ABDOMINAL HYSTERECTOMY    . CARPAL TUNNEL RELEASE Right   . KNEE SURGERY Bilateral     OB History    Gravida  2   Para      Term      Preterm      AB      Living  2     SAB      TAB       Ectopic      Multiple      Live Births  2            Home Medications    Prior to Admission medications   Medication Sig Start Date End Date Taking? Authorizing Provider  allopurinol (ZYLOPRIM) 300 MG tablet Take 1 tablet (300 mg total) by mouth daily. 06/02/18   Kerin Perna, NP  Blood Glucose Monitoring Suppl (ACCU-CHEK AVIVA PLUS) w/Device KIT 1 each by Does not apply route daily. 10/07/17   Clent Demark, PA-C  buPROPion (WELLBUTRIN XL) 300 MG 24 hr tablet Take 1 tablet (300 mg total) by mouth daily. 06/02/18   Kerin Perna, NP  CELEBREX 100 MG capsule TAKE 1 CAPSULE (100 MG TOTAL) BY MOUTH 2 (TWO) TIMES DAILY. 01/13/19   Kerin Perna, NP  cyclobenzaprine (FLEXERIL) 5 MG tablet Take 1-2 tablets (5-10 mg total) by mouth 2 (two) times daily as needed for muscle spasms. 01/24/19   Mirela Parsley C, PA-C  dapagliflozin propanediol (FARXIGA) 10 MG TABS tablet Take 10 mg by mouth daily. 06/02/18   Kerin Perna, NP  DULoxetine (CYMBALTA) 60 MG capsule Take 1 capsule (60 mg total) by mouth daily. 06/02/18   Kerin Perna, NP  gabapentin (NEURONTIN) 300 MG capsule Take 1 capsule (300 mg total) by mouth at bedtime. 06/02/18   Kerin Perna, NP  glucose blood (TRUE METRIX BLOOD GLUCOSE TEST) test strip Use as instructed. Must have office visit for refills 01/05/19   Charlott Rakes, MD  insulin aspart (NOVOLOG FLEXPEN) 100 UNIT/ML FlexPen Inject 2-10 Units into the skin See admin instructions for 30 days. Inject 2-10 units subcutaneously three times daily before meals per sliding scale: CBG 150-200 = 2 units;  201-251 =  4 units; 251-300 = 6 units;  301-350 = 8 units;  351-400 = 10 units 06/02/18 07/02/18  Kerin Perna, NP  Insulin Glargine (LANTUS SOLOSTAR) 100 UNIT/ML Solostar Pen Inject 55 units at every 12 hours 12/23/18   Kerin Perna, NP  Insulin Pen Needle (PEN NEEDLES) 30G X 8 MM MISC Inject 1 application into the skin 5 (five) times  daily. 11/25/18   Charlott Rakes, MD  Lancets (ACCU-CHEK SOFT TOUCH) lancets Use TID. 10/07/17   Clent Demark, PA-C  lisinopril (PRINIVIL,ZESTRIL) 10 MG tablet Take 1 tablet (10 mg total) by mouth daily. 06/02/18   Kerin Perna, NP  naproxen (NAPROSYN) 500 MG tablet Take 1 tablet (500 mg total) by mouth 2 (two) times daily. 01/24/19   Kemiyah Tarazon C, PA-C  omega-3 acid ethyl esters (LOVAZA) 1 g capsule Take 2 capsules (2 g total) by mouth 2 (two) times daily. 06/02/18   Kerin Perna, NP  rosuvastatin (CRESTOR) 20 MG tablet Take 1 tablet (20 mg total) by mouth daily. 06/02/18   Kerin Perna, NP    Family History Family History  Problem Relation Age of Onset  . Diabetes Mother   . Bladder Cancer Mother   . Depression Mother   . Hypertension Father   . Diabetes Sister   . Hypertension Sister   . Depression Sister   . Hypertension Sister   . Heart attack Brother 7  . Depression Brother   . Suicidality Brother   . Skin cancer Brother   . Other Brother        unsure of history - says he does no go to the doctor  . Breast cancer Neg Hx     Social History Social History   Tobacco Use  . Smoking status: Never Smoker  . Smokeless tobacco: Never Used  Substance Use Topics  . Alcohol use: No  . Drug use: No     Allergies   Cortisone and Penicillins   Review of Systems Review of Systems  Constitutional: Negative for activity change, appetite change, chills, fatigue and fever.  HENT: Negative for congestion, ear pain, rhinorrhea, sinus pressure, sore throat and trouble swallowing.   Eyes: Negative for discharge and redness.  Respiratory: Negative for cough, chest tightness and shortness of breath.   Cardiovascular: Negative for chest pain.  Gastrointestinal: Negative for abdominal pain, diarrhea, nausea and vomiting.  Genitourinary: Negative for dysuria, flank pain, genital sores, hematuria, menstrual problem, vaginal bleeding, vaginal discharge and  vaginal pain.  Musculoskeletal: Positive for back pain and myalgias.  Skin: Negative for rash.  Neurological: Negative for dizziness, light-headedness and headaches.     Physical Exam Triage Vital Signs ED Triage Vitals  Enc Vitals Group     BP 01/24/19 1101 (!) 146/65     Pulse Rate 01/24/19 1101 99     Resp 01/24/19 1101 16     Temp 01/24/19 1101 99.1 F (37.3 C)     Temp Source 01/24/19 1101  Oral     SpO2 01/24/19 1101 100 %     Weight --      Height --      Head Circumference --      Peak Flow --      Pain Score 01/24/19 1059 8     Pain Loc --      Pain Edu? --      Excl. in [place]? --    No data found.  Updated Vital Signs BP (!) 146/65 (BP Location: Right Arm)   Pulse 99   Temp 99.1 F (37.3 C) (Oral)   Resp 16   SpO2 100%   Visual Acuity Right Eye Distance:   Left Eye Distance:   Bilateral Distance:    Right Eye Near:   Left Eye Near:    Bilateral Near:     Physical Exam Vitals signs and nursing note reviewed.  Constitutional:      General: She is not in acute distress.    Appearance: She is well-developed.  HENT:     Head: Normocephalic and atraumatic.  Eyes:     Conjunctiva/sclera: Conjunctivae normal.  Neck:     Musculoskeletal: Neck supple.  Cardiovascular:     Rate and Rhythm: Normal rate and regular rhythm.     Heart sounds: No murmur.  Pulmonary:     Effort: Pulmonary effort is normal. No respiratory distress.     Breath sounds: Normal breath sounds.     Comments: Breathing comfortably at rest, CTABL, no wheezing, rales or other adventitious sounds auscultated Abdominal:     Palpations: Abdomen is soft.     Tenderness: There is no abdominal tenderness.     Comments: Abdomen soft, nondistended, nontender to light and deep palpation throughout abdomen  Musculoskeletal:     Comments: Nontender to palpation of cervical spine midline, mild tenderness to mid thoracic spine midline and lower thoracic spine, tenderness to lower lumbar spine  midline, increased tenderness to far right/lateral thoracic musculature extending to anterior lower right ribs No anterior chest tenderness Full active range of motion of shoulders/arms  Skin:    General: Skin is warm and dry.  Neurological:     Mental Status: She is alert.      UC Treatments / Results  Labs (all labs ordered are listed, but only abnormal results are displayed) Labs Reviewed  GLUCOSE, CAPILLARY - Abnormal; Notable for the following components:      Result Value   Glucose-Capillary 136 (*)    All other components within normal limits    EKG   Radiology No results found.  Procedures Procedures (including critical care time)  Medications Ordered in UC Medications - No data to display  Initial Impression / Assessment and Plan / UC Course  I have reviewed the triage vital signs and the nursing notes.  Pertinent labs & imaging results that were available during my care of the patient were reviewed by me and considered in my medical decision making (see chart for details).     Most likely MSK strain versus chest wall inflammation, no mechanism of injury, do not suspect acute bony abnormality, lungs clear, no respiratory symptoms.  Urine negative for blood, leuks and nitrites.  Does have significant amount of glucose, discussed this with patient and continue to monitor sugars, take DM medicine and follow-up with PCP.  Treating with paroxetine and Flexeril.  Continue to monitor,Discussed strict return precautions. Patient verbalized understanding and is agreeable with plan.  Final Clinical Impressions(s) / UC Diagnoses  Final diagnoses:  Acute right-sided thoracic back pain     Discharge Instructions     Please try naprosyn twice daily with food OR Use anti-inflammatories for pain/swelling. You may take up to 800 mg Ibuprofen every 8 hours with food. You may supplement Ibuprofen with Tylenol [phone] mg every 8 hours.   You may use flexeril as needed to  help with pain. This is a muscle relaxer and causes sedation- please use only at bedtime or when you will be home and not have to drive/work  Alternate ice and heat to area   Follow up if not resolving or worsening, developing shortness of breath, cough, fever, urinary symtpoms    ED Prescriptions    Medication Sig Dispense Auth. Provider   naproxen (NAPROSYN) 500 MG tablet Take 1 tablet (500 mg total) by mouth 2 (two) times daily. 30 tablet Genny Caulder C, PA-C   cyclobenzaprine (FLEXERIL) 5 MG tablet Take 1-2 tablets (5-10 mg total) by mouth 2 (two) times daily as needed for muscle spasms. 24 tablet Shawn Dannenberg, [place] C, PA-C     PDMP not reviewed this encounter.   Janith Lima, [place] 01/24/19 1132

## 2019-01-24 NOTE — Discharge Instructions (Signed)
Please try naprosyn twice daily with food OR Use anti-inflammatories for pain/swelling. You may take up to 800 mg Ibuprofen every 8 hours with food. You may supplement Ibuprofen with Tylenol [phone] mg every 8 hours.   You may use flexeril as needed to help with pain. This is a muscle relaxer and causes sedation- please use only at bedtime or when you will be home and not have to drive/work  Alternate ice and heat to area   Follow up if not resolving or worsening, developing shortness of breath, cough, fever, urinary symtpoms

## 2019-01-24 NOTE — ED Triage Notes (Signed)
Pt present back/side pain. Symptoms started over a week ago. Pt came in on Friday for same symptoms that has gotten worst.

## 2019-01-25 ENCOUNTER — Telehealth (HOSPITAL_COMMUNITY): Payer: Self-pay | Admitting: Emergency Medicine

## 2019-01-25 ENCOUNTER — Encounter (HOSPITAL_COMMUNITY): Payer: Self-pay

## 2019-01-25 LAB — POCT URINALYSIS DIP (DEVICE)
Bilirubin Urine: NEGATIVE
Glucose, UA: >=1000 mg/dL — AB
Hgb urine dipstick: NEGATIVE
Ketones, ur: NEGATIVE mg/dL
Leukocytes,Ua: NEGATIVE
Nitrite: NEGATIVE
Protein, ur: NEGATIVE mg/dL
Specific Gravity, Urine: 1.015 (ref 1.005–1.030)
Urobilinogen, UA: 0.2 mg/dL (ref 0.0–1.0)
pH: 5.5 (ref 5.0–8.0)

## 2019-01-25 NOTE — Telephone Encounter (Signed)
Positive Covid, attempted to reach patient and discuss her results ,no answer, left vm to return call. Sent [employer].

## 2019-01-25 NOTE — Telephone Encounter (Signed)
Patient contacted and made aware of positive covid   results, all questions answered   

## 2019-01-29 ENCOUNTER — Other Ambulatory Visit (HOSPITAL_COMMUNITY): Payer: Self-pay | Admitting: *Deleted

## 2019-01-29 DIAGNOSIS — Z1231 Encounter for screening mammogram for malignant neoplasm of breast: Secondary | ICD-10-CM

## 2019-02-03 ENCOUNTER — Emergency Department (HOSPITAL_COMMUNITY): Payer: Self-pay

## 2019-02-03 ENCOUNTER — Emergency Department (HOSPITAL_COMMUNITY)
Admission: EM | Admit: 2019-02-03 | Discharge: 2019-02-03 | Disposition: A | Discharge status: Home / Self Care | Payer: Self-pay | Attending: Emergency Medicine | Admitting: Emergency Medicine

## 2019-02-03 ENCOUNTER — Other Ambulatory Visit: Payer: Self-pay

## 2019-02-03 ENCOUNTER — Encounter (HOSPITAL_COMMUNITY): Payer: Self-pay | Admitting: *Deleted

## 2019-02-03 DIAGNOSIS — Z794 Long term (current) use of insulin: Secondary | ICD-10-CM | POA: Insufficient documentation

## 2019-02-03 DIAGNOSIS — I10 Essential (primary) hypertension: Secondary | ICD-10-CM | POA: Insufficient documentation

## 2019-02-03 DIAGNOSIS — M546 Pain in thoracic spine: Secondary | ICD-10-CM

## 2019-02-03 DIAGNOSIS — U071 COVID-19: Secondary | ICD-10-CM

## 2019-02-03 DIAGNOSIS — J1282 Pneumonia due to coronavirus disease 2019: Secondary | ICD-10-CM

## 2019-02-03 DIAGNOSIS — Z79899 Other long term (current) drug therapy: Secondary | ICD-10-CM | POA: Insufficient documentation

## 2019-02-03 DIAGNOSIS — E1140 Type 2 diabetes mellitus with diabetic neuropathy, unspecified: Secondary | ICD-10-CM | POA: Insufficient documentation

## 2019-02-03 DIAGNOSIS — J1289 Other viral pneumonia: Secondary | ICD-10-CM | POA: Insufficient documentation

## 2019-02-03 LAB — CBC WITH DIFFERENTIAL/PLATELET
Abs Immature Granulocytes: 0.5 10*3/uL — ABNORMAL HIGH (ref 0.00–0.07)
Basophils Absolute: 0.1 10*3/uL (ref 0.0–0.1)
Basophils Relative: 1 %
Eosinophils Absolute: 0.2 10*3/uL (ref 0.0–0.5)
Eosinophils Relative: 2 %
HCT: 42.2 % (ref 36.0–46.0)
Hemoglobin: 12.8 g/dL (ref 12.0–15.0)
Immature Granulocytes: 5 %
Lymphocytes Relative: 16 %
Lymphs Abs: 1.7 10*3/uL (ref 0.7–4.0)
MCH: 26.8 pg (ref 26.0–34.0)
MCHC: 30.3 g/dL (ref 30.0–36.0)
MCV: 88.3 fL (ref 80.0–100.0)
Monocytes Absolute: 0.7 10*3/uL (ref 0.1–1.0)
Monocytes Relative: 7 %
Neutro Abs: 7.2 10*3/uL (ref 1.7–7.7)
Neutrophils Relative %: 69 %
Platelets: 491 10*3/uL — ABNORMAL HIGH (ref 150–400)
RBC: 4.78 MIL/uL (ref 3.87–5.11)
RDW: 13.5 % (ref 11.5–15.5)
WBC: 10.4 10*3/uL (ref 4.0–10.5)
nRBC: 0 % (ref 0.0–0.2)

## 2019-02-03 LAB — BASIC METABOLIC PANEL
Anion gap: 8 (ref 5–15)
BUN: 15 mg/dL (ref 8–23)
CO2: 26 mmol/L (ref 22–32)
Calcium: 9.7 mg/dL (ref 8.9–10.3)
Chloride: 106 mmol/L (ref 98–111)
Creatinine, Ser: 0.84 mg/dL (ref 0.44–1.00)
GFR calc Af Amer: >60 mL/min (ref >60)
GFR calc non Af Amer: >60 mL/min (ref >60)
Glucose, Bld: 194 mg/dL — ABNORMAL HIGH (ref 70–99)
Potassium: 4.4 mmol/L (ref 3.5–5.1)
Sodium: 140 mmol/L (ref 135–145)

## 2019-02-03 MED ORDER — METHOCARBAMOL 500 MG PO TABS
500.0000 mg | ORAL_TABLET | Freq: Once | ORAL | Status: AC
  Administered 2019-02-03: 14:00:00 500 mg via ORAL
  Filled 2019-02-03: qty 1

## 2019-02-03 MED ORDER — SODIUM CHLORIDE (PF) 0.9 % IJ SOLN
INTRAMUSCULAR | Status: AC
  Filled 2019-02-03: qty 50

## 2019-02-03 MED ORDER — METHOCARBAMOL 500 MG PO TABS: 500.0000 mg | ORAL_TABLET | Freq: Two times a day (BID) | ORAL | 0 refills | Status: DC

## 2019-02-03 MED ORDER — AZITHROMYCIN 250 MG PO TABS: 250.0000 mg | ORAL_TABLET | Freq: Every day | ORAL | 0 refills | Status: DC

## 2019-02-03 MED ORDER — ACETAMINOPHEN 500 MG PO TABS
1000.0000 mg | ORAL_TABLET | Freq: Once | ORAL | Status: AC
  Administered 2019-02-03: 14:00:00 1000 mg via ORAL
  Filled 2019-02-03: qty 2

## 2019-02-03 MED ORDER — IOHEXOL 350 MG/ML SOLN
100.0000 mL | Freq: Once | INTRAVENOUS | Status: AC | PRN
  Administered 2019-02-03: 16:00:00 100 mL via INTRAVENOUS

## 2019-02-03 MED ORDER — GUAIFENESIN 100 MG/5ML PO LIQD: 200.0000 mg | ORAL | 0 refills | Status: DC | PRN

## 2019-02-03 MED ORDER — ALBUTEROL SULFATE HFA 108 (90 BASE) MCG/ACT IN AERS
2.0000 | INHALATION_SPRAY | Freq: Once | RESPIRATORY_TRACT | Status: AC
  Administered 2019-02-03: 14:00:00 2 via RESPIRATORY_TRACT
  Filled 2019-02-03: qty 6.7

## 2019-02-03 MED ORDER — LIDOCAINE 5 % EX PTCH
1.0000 | MEDICATED_PATCH | CUTANEOUS | Status: DC
  Administered 2019-02-03: 1 via TRANSDERMAL
  Filled 2019-02-03: qty 1

## 2019-02-03 MED FILL — AZITHROMYCIN 250 MG TABLET: 250 | 5 days supply | Qty: 6 | Fill #0

## 2019-02-03 MED FILL — METHOCARBAMOL 500 MG TABS: 500 | 10 days supply | Qty: 20 | Fill #0

## 2019-02-03 NOTE — ED Triage Notes (Signed)
Per pt, states she tested positive for covid on 10/28-states she is having back pian and cough-here for pain control

## 2019-02-03 NOTE — ED Provider Notes (Signed)
[facility] DEPT Provider Note   CSN: [id number] Arrival date & time: 02/03/19  1218     History   Chief Complaint Chief Complaint  Patient presents with  . Back Pain    COVID+    HPI Ann Pace is a 63 y.o. female.     Ann Pace is a 63 y.o. female who is currently Covid positive with known history of diabetes, neuropathy, bronchitis and depression, who presents to the ED for evaluation of thoracic back pain and shortness of breath.  Patient was initially diagnosed with coronavirus on 10/28.  Since then she has been experiencing cough, shortness of breath and body aches.  She has had persistent thoracic back pain that is worse on the right side and starts in the midline back and wraps around to the right anterior ribs.  She was seen at urgent care for this pain on 11/2, it was thought to likely be musculoskeletal and she has been treating pain with Flexeril and naproxen without improvement.  She continues to have nonproductive cough and associated shortness of breath.  She reports pain is worse with movement, palpation, coughing and is particularly worse with deep breathing.  She denies any other chest pain.  Has myalgias all over but thoracic back pain seems to be worse than anywhere else.  She denies any associated vomiting, diarrhea or abdominal pain.  She has had low-grade fevers of 99.  She has been treating symptoms supportively.     Past Medical History:  Diagnosis Date  . Bronchitis   . Cataracts, bilateral   . Depression   . Diabetes mellitus without complication ([place])   . Neuropathy     Patient Active Problem List   Diagnosis Date Noted  . Hyperglycemia 04/11/2018  . Hyperkalemia 04/11/2018  . Hypotension due to hypovolemia 04/09/2018  . Urinary tract infection without hematuria 04/09/2018  . Peripheral polyneuropathy 03/12/2018  . Type 2 diabetes mellitus ([place]) 05/15/2017  . Dyslipidemia associated with type 2 diabetes  mellitus ([place]) 12/12/2015  . Essential hypertension 12/12/2015    Past Surgical History:  Procedure Laterality Date  . ABDOMINAL HYSTERECTOMY    . CARPAL TUNNEL RELEASE Right   . KNEE SURGERY Bilateral      OB History    Gravida  2   Para      Term      Preterm      AB      Living  2     SAB      TAB      Ectopic      Multiple      Live Births  2            Home Medications    Prior to Admission medications   Medication Sig Start Date End Date Taking? Authorizing Provider  allopurinol (ZYLOPRIM) 300 MG tablet Take 1 tablet (300 mg total) by mouth daily. 06/02/18   Kerin Perna, NP  azithromycin (ZITHROMAX) 250 MG tablet Take 1 tablet (250 mg total) by mouth daily. Take first 2 tablets together, then 1 every day until finished. 02/03/19   Jacqlyn Larsen, PA-C  Blood Glucose Monitoring Suppl (ACCU-CHEK AVIVA PLUS) w/Device KIT 1 each by Does not apply route daily. 10/07/17   Clent Demark, PA-C  buPROPion (WELLBUTRIN XL) 300 MG 24 hr tablet Take 1 tablet (300 mg total) by mouth daily. 06/02/18   Kerin Perna, NP  CELEBREX 100 MG capsule TAKE 1 CAPSULE (100 MG  TOTAL) BY MOUTH 2 (TWO) TIMES DAILY. 01/13/19   Kerin Perna, NP  cyclobenzaprine (FLEXERIL) 5 MG tablet Take 1-2 tablets (5-10 mg total) by mouth 2 (two) times daily as needed for muscle spasms. 01/24/19   Wieters, Hallie C, PA-C  dapagliflozin propanediol (FARXIGA) 10 MG TABS tablet Take 10 mg by mouth daily. 06/02/18   Kerin Perna, NP  DULoxetine (CYMBALTA) 60 MG capsule Take 1 capsule (60 mg total) by mouth daily. 06/02/18   Kerin Perna, NP  gabapentin (NEURONTIN) 300 MG capsule Take 1 capsule (300 mg total) by mouth at bedtime. 06/02/18   Kerin Perna, NP  glucose blood (TRUE METRIX BLOOD GLUCOSE TEST) test strip Use as instructed. Must have office visit for refills 01/05/19   Charlott Rakes, MD  guaiFENesin (ROBITUSSIN) 100 MG/5ML liquid Take 10 mLs (200 mg  total) by mouth every 4 (four) hours as needed for cough. 02/03/19   Jacqlyn Larsen, PA-C  insulin aspart (NOVOLOG FLEXPEN) 100 UNIT/ML FlexPen Inject 2-10 Units into the skin See admin instructions for 30 days. Inject 2-10 units subcutaneously three times daily before meals per sliding scale: CBG 150-200 = 2 units;  201-251 =  4 units; 251-300 = 6 units;  301-350 = 8 units;  351-400 = 10 units 06/02/18 07/02/18  Kerin Perna, NP  Insulin Glargine (LANTUS SOLOSTAR) 100 UNIT/ML Solostar Pen Inject 55 units at every 12 hours 12/23/18   Kerin Perna, NP  Insulin Pen Needle (PEN NEEDLES) 30G X 8 MM MISC Inject 1 application into the skin 5 (five) times daily. 11/25/18   Charlott Rakes, MD  Lancets (ACCU-CHEK SOFT TOUCH) lancets Use TID. 10/07/17   Clent Demark, PA-C  lisinopril (PRINIVIL,ZESTRIL) 10 MG tablet Take 1 tablet (10 mg total) by mouth daily. 06/02/18   Kerin Perna, NP  methocarbamol (ROBAXIN) 500 MG tablet Take 1 tablet (500 mg total) by mouth 2 (two) times daily. 02/03/19   Jacqlyn Larsen, PA-C  naproxen (NAPROSYN) 500 MG tablet Take 1 tablet (500 mg total) by mouth 2 (two) times daily. 01/24/19   Wieters, Hallie C, PA-C  omega-3 acid ethyl esters (LOVAZA) 1 g capsule Take 2 capsules (2 g total) by mouth 2 (two) times daily. 06/02/18   Kerin Perna, NP  rosuvastatin (CRESTOR) 20 MG tablet Take 1 tablet (20 mg total) by mouth daily. 06/02/18   Kerin Perna, NP    Family History Family History  Problem Relation Age of Onset  . Diabetes Mother   . Bladder Cancer Mother   . Depression Mother   . Hypertension Father   . Diabetes Sister   . Hypertension Sister   . Depression Sister   . Hypertension Sister   . Heart attack Brother 19  . Depression Brother   . Suicidality Brother   . Skin cancer Brother   . Other Brother        unsure of history - says he does no go to the doctor  . Breast cancer Neg Hx     Social History Social History   Tobacco  Use  . Smoking status: Never Smoker  . Smokeless tobacco: Never Used  Substance Use Topics  . Alcohol use: No  . Drug use: No     Allergies   Cortisone and Penicillins   Review of Systems Review of Systems  Constitutional: Positive for chills and fever.  HENT: Negative.   Respiratory: Positive for cough and shortness of breath.  Cardiovascular: Negative for chest pain.  Gastrointestinal: Negative for abdominal pain, diarrhea, nausea and vomiting.  Genitourinary: Negative for dysuria.  Musculoskeletal: Positive for back pain (Thoracic). Negative for arthralgias and neck pain.  Skin: Negative for color change and rash.  Neurological: Negative for dizziness, weakness, light-headedness and numbness.     Physical Exam Updated Vital Signs BP 134/74 (BP Location: Left Arm)   Pulse 89   Temp 99.6 F (37.6 C) (Oral)   Resp 16   SpO2 95%   Physical Exam Vitals signs and nursing note reviewed.  Constitutional:      General: She is not in acute distress.    Appearance: Normal appearance. She is well-developed and normal weight. She is not ill-appearing or diaphoretic.  HENT:     Head: Normocephalic and atraumatic.     Mouth/Throat:     Mouth: Mucous membranes are moist.     Pharynx: Oropharynx is clear.  Eyes:     General:        Right eye: No discharge.        Left eye: No discharge.  Neck:     Musculoskeletal: Neck supple.  Cardiovascular:     Rate and Rhythm: Normal rate and regular rhythm.     Pulses: Normal pulses.     Heart sounds: Normal heart sounds. No murmur. No friction rub. No gallop.   Pulmonary:     Effort: Pulmonary effort is normal. No respiratory distress.     Breath sounds: Normal breath sounds.     Comments: Respirations equal and unlabored, patient able to speak in full sentences, lungs clear to auscultation bilaterally Abdominal:     General: Abdomen is flat. Bowel sounds are normal. There is no distension.     Palpations: Abdomen is soft. There  is no mass.     Tenderness: There is no abdominal tenderness. There is no guarding.     Comments: Abdomen soft, nondistended, nontender to palpation in all quadrants without guarding or peritoneal signs  Musculoskeletal:        General: Tenderness present. No deformity.       Back:     Right lower leg: No edema.     Left lower leg: No edema.     Comments: Pain starting at midline and going across the right lower thoracic back wrapping around to the right lower lateral ribs, no overlying skin changes, no palpable deformity.  Pain is worse with palpation.  Skin:    General: Skin is warm and dry.  Neurological:     Mental Status: She is alert.     Coordination: Coordination normal.  Psychiatric:        Behavior: Behavior normal.      ED Treatments / Results  Labs (all labs ordered are listed, but only abnormal results are displayed) Labs Reviewed  BASIC METABOLIC PANEL - Abnormal; Notable for the following components:      Result Value   Glucose, Bld 194 (*)    All other components within normal limits  CBC WITH DIFFERENTIAL/PLATELET - Abnormal; Notable for the following components:   Platelets 491 (*)    Abs Immature Granulocytes 0.50 (*)    All other components within normal limits    EKG None  Radiology Ct Angio Chest Pe W And/or Wo Contrast  Result Date: 02/03/2019 CLINICAL DATA:  COVID-19 positive patient with worsening back pain and shortness of breath. Evaluate for pulmonary embolism. EXAM: CT ANGIOGRAPHY CHEST WITH CONTRAST TECHNIQUE: Multidetector CT imaging of the chest  was performed using the standard protocol during bolus administration of intravenous contrast. Multiplanar CT image reconstructions and MIPs were obtained to evaluate the vascular anatomy. CONTRAST:  126m OMNIPAQUE IOHEXOL 350 MG/ML SOLN COMPARISON:  04/10/2018 FINDINGS: Cardiovascular: Heart is normal size. Thoracic aorta is within normal. Pulmonary arterial system is well opacified without evidence  of emboli. Remaining vascular structures are unremarkable. Mediastinum/Nodes: 1 cm right infrahilar lymph node. No significant mediastinal or left hilar adenopathy. Remaining mediastinal structures are within normal. Lungs/Pleura: Lungs are adequately inflated and demonstrate moderate bilateral patchy airspace process throughout the lungs likely multifocal pneumonia in this COVID-19 positive patient. No evidence of effusion. Airways are normal. Upper Abdomen: No acute findings. Musculoskeletal: Minimal degenerative change of the spine. Round sclerotic focus over the midthoracic spine unchanged likely bone island. Review of the MIP images confirms the above findings. IMPRESSION: 1. Significant bilateral multifocal airspace process likely viral multifocal pneumonia in this COVID-19 positive patient. 1 cm right infrahilar lymph node likely reactive. 2.  No evidence of pulmonary embolism. Electronically Signed   By: DMarin OlpM.D.   On: 02/03/2019 16:22    Procedures Procedures (including critical care time)  Medications Ordered in ED Medications  lidocaine (LIDODERM) 5 % 1 patch (1 patch Transdermal Patch Applied 02/03/19 1400)  sodium chloride (PF) 0.9 % injection (has no administration in time range)  albuterol (VENTOLIN HFA) 108 (90 Base) MCG/ACT inhaler 2 puff (2 puffs Inhalation Given 02/03/19 1401)  acetaminophen (TYLENOL) tablet 1,000 mg (1,000 mg Oral Given 02/03/19 1358)  methocarbamol (ROBAXIN) tablet 500 mg (500 mg Oral Given 02/03/19 1358)  iohexol (OMNIPAQUE) 350 MG/ML injection 100 mL (100 mLs Intravenous Contrast Given 02/03/19 1551)     Initial Impression / Assessment and Plan / ED Course  I have reviewed the triage vital signs and the nursing notes.  Pertinent labs & imaging results that were available during my care of the patient were reviewed by me and considered in my medical decision making (see chart for details).   63year old female known Covid positive with infection  on 10/28 presenting with persisting thoracic back pain.  Vitals are reassuring on arrival and she has no hypoxia.  Patient ambulated in the room and maintained O2 sats greater than 93% without significant increased work of breathing.  On exam she has thoracic back pain starting at midline and wrapping around to the right lateral ribs that is worse with palpation and movement but also with deep breath she has been treating this symptomatically with NSAIDs and muscle relaxers without improvement.  Given that she is currently Covid positive and this could potentially cause a hypercoagulable state concern for potential PE.  Will get basic labs and CT angio of the chest which will allow [place] evaluate for blood clot.  Will treat pain with albuterol, Tylenol, Lidoderm and Robaxin.  Lab work is reassuring, no leukocytosis, normal hemoglobin, glucose of 194, patient known diabetic, no anion gap and no other electrolyte derangements, normal renal function.  CTA shows evidence of Covid pneumonia as expected, but no evidence of PE.  Patient reports that her pain is improved with treatment in the ED.  Given continued evidence of Covid pneumonia after 15 days of infection will treat with Z-Pak for any superimposed bacterial pneumonia, Robaxin seem to help patient's pain better than Flexeril so this was prescribed we will have patient continue with naproxen at home and she has been given an albuterol inhaler.  Encouraged her to use home pulse ox monitoring.  Strict  return precautions discussed.  Patient expresses understanding and agreement with plan.  Discharged home in good condition.  Final Clinical Impressions(s) / ED Diagnoses   Final diagnoses:  Pneumonia due to COVID-19 virus  Acute right-sided thoracic back pain    ED Discharge Orders         Ordered    azithromycin (ZITHROMAX) 250 MG tablet  Daily     02/03/19 1638    methocarbamol (ROBAXIN) 500 MG tablet  2 times daily     02/03/19 1638    guaiFENesin  (ROBITUSSIN) 100 MG/5ML liquid  Every 4 hours PRN     02/03/19 1638           Jacqlyn Larsen, [place] 02/03/19 1756    Lacretia Leigh, MD 02/04/19 [phone]

## 2019-02-03 NOTE — Discharge Instructions (Addendum)
Your work-up today is reassuring, your CT scan does not show any evidence of blood clots, does show Covid pneumonia as we expected.  Please continue taking naproxen twice daily for back pain, you can use Robaxin to help reduce muscle spasm instead of Flexeril since this seemed to help you more.  Use inhaler as needed for coughing spasms and use guaifenesin cough syrup to help reduce cough as I think this will help with the inflammation and pain in your back.  Please take antibiotics as directed to cover for any superimposed bacterial infection on top of your viral pneumonia.  Continue to quarantine at home until you have been symptom-free for at least 72 hours.  Return to the ED if you have new or worsening shortness of breath, persistent fevers.  I recommend purchasing a home pulse ox so that you can monitor your oxygen levels if this level is below 92% you should come back to the hospital for reevaluation.

## 2019-02-03 NOTE — ED Triage Notes (Signed)
Pt presents with a positive COVID test on 10/28.  Today pt report back pain and SOB since diagnosis. Pt reports that she has not gotten better but gotten a little worse.  Pt a/o x 4 and ambulatory. Pt is 95-97% RA.

## 2019-02-04 ENCOUNTER — Telehealth (INDEPENDENT_AMBULATORY_CARE_PROVIDER_SITE_OTHER): Payer: Self-pay

## 2019-02-04 NOTE — Telephone Encounter (Signed)
FWD to PCP. Ann Pace S Ann Pace, CMA  

## 2019-02-04 NOTE — Telephone Encounter (Signed)
Patient called to make a medication refill for   lidocaine patch 5%  Patient states she was given a patch in the ed and has helped tremendously.   Patient uses [facility] [place] [facility] [address] [place]   Please advice [phone]

## 2019-02-07 ENCOUNTER — Other Ambulatory Visit (INDEPENDENT_AMBULATORY_CARE_PROVIDER_SITE_OTHER): Payer: Self-pay | Admitting: Primary Care

## 2019-02-07 MED ORDER — LIDOCAINE 5 % EX PTCH: 1.0000 | MEDICATED_PATCH | CUTANEOUS | 1 refills | Status: AC

## 2019-02-08 MED FILL — LIDOCAINE 5 % PTCH: 5 | 30 days supply | Qty: 30 | Fill #0

## 2019-03-12 MED FILL — LIDOCAINE 5 % PTCH: 5 | 30 days supply | Qty: 30 | Fill #1

## 2019-03-12 MED FILL — ALLOPURINOL 300 MG TAB: 300 | 30 days supply | Qty: 30 | Fill #5

## 2019-03-12 MED FILL — ROSUVASTATIN CALCIUM 20 MG: 20 | 30 days supply | Qty: 30 | Fill #4

## 2019-03-12 MED FILL — !HUMALOG 100 UNITS/ML KWIKP: 100 | 30 days supply | Qty: 9 | Fill #1

## 2019-03-12 MED FILL — GABAPENTIN 300 MG CAPSULE: 300 | 30 days supply | Qty: 30 | Fill #4

## 2019-03-12 MED FILL — DULoxetine HCL 60 MG CPEP: 60 | 30 days supply | Qty: 30 | Fill #3

## 2019-03-12 MED FILL — LISINOPRIL 10 MG TABS: 10 | 30 days supply | Qty: 30 | Fill #4

## 2019-03-12 MED FILL — BUPROPION HCL XL 300 MG TAB: 300 | 30 days supply | Qty: 30 | Fill #5

## 2019-03-16 ENCOUNTER — Other Ambulatory Visit: Payer: Self-pay | Admitting: Family Medicine

## 2019-03-16 DIAGNOSIS — E1142 Type 2 diabetes mellitus with diabetic polyneuropathy: Secondary | ICD-10-CM

## 2019-03-16 MED FILL — TRUEPLUS 5-BEVEL PEN NEEDLE: 31G X 5 MM | 20 days supply | Qty: 100 | Fill #2

## 2019-03-16 NOTE — Telephone Encounter (Signed)
FWD to PCP

## 2019-03-17 MED FILL — !LANTUS SOLOSTAR 100UNITS/M: 100 | 16 days supply | Qty: 18 | Fill #0

## 2019-04-06 ENCOUNTER — Ambulatory Visit (HOSPITAL_COMMUNITY): Payer: Self-pay

## 2019-04-06 MED FILL — !LANTUS SOLOSTAR 100UNITS/M: 100 | 16 days supply | Qty: 18 | Fill #1

## 2019-04-06 MED FILL — CELECOXIB 100 MG CAP: 100 | 30 days supply | Qty: 60 | Fill #1

## 2019-05-03 ENCOUNTER — Other Ambulatory Visit (INDEPENDENT_AMBULATORY_CARE_PROVIDER_SITE_OTHER): Payer: Self-pay | Admitting: Primary Care

## 2019-05-03 DIAGNOSIS — F418 Other specified anxiety disorders: Secondary | ICD-10-CM

## 2019-05-03 MED FILL — $LANTUS SOLOSTAR 100 UNITS/: 100 | 30 days supply | Qty: 33 | Fill #2

## 2019-05-03 MED FILL — ALLOPURINOL 300 MG TAB: 300 | 30 days supply | Qty: 30 | Fill #6

## 2019-05-03 MED FILL — DULoxetine HCL 60 MG CPEP: 60 | 30 days supply | Qty: 30 | Fill #4

## 2019-05-03 MED FILL — CELECOXIB 100 MG CAP: 100 | 30 days supply | Qty: 60 | Fill #2

## 2019-05-03 MED FILL — LISINOPRIL 10 MG TABS: 10 | 30 days supply | Qty: 30 | Fill #5

## 2019-05-03 MED FILL — ROSUVASTATIN CALCIUM 20 MG: 20 | 30 days supply | Qty: 30 | Fill #5

## 2019-05-03 MED FILL — FARXIGA 10 MG TABLET: 10 | 30 days supply | Qty: 30 | Fill #3

## 2019-05-03 NOTE — Telephone Encounter (Signed)
Sent to PCP ?

## 2019-05-04 MED FILL — BUPROPION HCL ER (XL) 300 M: 300 | 30 days supply | Qty: 30 | Fill #0

## 2019-05-04 NOTE — Telephone Encounter (Signed)
Needs appointment

## 2019-06-14 ENCOUNTER — Other Ambulatory Visit (INDEPENDENT_AMBULATORY_CARE_PROVIDER_SITE_OTHER): Payer: Self-pay | Admitting: Primary Care

## 2019-06-14 DIAGNOSIS — F418 Other specified anxiety disorders: Secondary | ICD-10-CM

## 2019-06-14 DIAGNOSIS — I10 Essential (primary) hypertension: Secondary | ICD-10-CM

## 2019-06-14 DIAGNOSIS — E1142 Type 2 diabetes mellitus with diabetic polyneuropathy: Secondary | ICD-10-CM

## 2019-06-14 DIAGNOSIS — Z760 Encounter for issue of repeat prescription: Secondary | ICD-10-CM

## 2019-06-14 MED FILL — $LANTUS SOLOSTAR 100 UNITS/: 100 | 19 days supply | Qty: 21 | Fill #3

## 2019-06-14 MED FILL — CELECOXIB 100 MG CAP: 100 | 30 days supply | Qty: 60 | Fill #3

## 2019-06-15 NOTE — Telephone Encounter (Signed)
Sent to PCP ?

## 2019-08-25 ENCOUNTER — Encounter (HOSPITAL_COMMUNITY): Payer: Self-pay | Admitting: Emergency Medicine

## 2019-08-25 ENCOUNTER — Other Ambulatory Visit: Payer: Self-pay

## 2019-08-25 ENCOUNTER — Emergency Department (HOSPITAL_COMMUNITY)
Admission: EM | Admit: 2019-08-25 | Discharge: 2019-08-25 | Disposition: A | Discharge status: Home / Self Care | Payer: Medicare Other | Attending: Emergency Medicine | Admitting: Emergency Medicine

## 2019-08-25 DIAGNOSIS — I10 Essential (primary) hypertension: Secondary | ICD-10-CM | POA: Insufficient documentation

## 2019-08-25 DIAGNOSIS — E1165 Type 2 diabetes mellitus with hyperglycemia: Secondary | ICD-10-CM | POA: Insufficient documentation

## 2019-08-25 DIAGNOSIS — Z79899 Other long term (current) drug therapy: Secondary | ICD-10-CM | POA: Insufficient documentation

## 2019-08-25 DIAGNOSIS — Z794 Long term (current) use of insulin: Secondary | ICD-10-CM | POA: Insufficient documentation

## 2019-08-25 DIAGNOSIS — R739 Hyperglycemia, unspecified: Secondary | ICD-10-CM

## 2019-08-25 LAB — BASIC METABOLIC PANEL
Anion gap: 10 (ref 5–15)
BUN: 22 mg/dL (ref 8–23)
CO2: 23 mmol/L (ref 22–32)
Calcium: 10 mg/dL (ref 8.9–10.3)
Chloride: 103 mmol/L (ref 98–111)
Creatinine, Ser: 0.92 mg/dL (ref 0.44–1.00)
GFR calc Af Amer: >60 mL/min (ref >60)
GFR calc non Af Amer: >60 mL/min (ref >60)
Glucose, Bld: 397 mg/dL — ABNORMAL HIGH (ref 70–99)
Potassium: 4.9 mmol/L (ref 3.5–5.1)
Sodium: 136 mmol/L (ref 135–145)

## 2019-08-25 LAB — URINALYSIS, ROUTINE W REFLEX MICROSCOPIC
Bilirubin Urine: NEGATIVE
Glucose, UA: >=500 mg/dL — AB
Hgb urine dipstick: NEGATIVE
Ketones, ur: NEGATIVE mg/dL
Leukocytes,Ua: NEGATIVE
Nitrite: NEGATIVE
Protein, ur: NEGATIVE mg/dL
Specific Gravity, Urine: 1.029 (ref 1.005–1.030)
pH: 5 (ref 5.0–8.0)

## 2019-08-25 LAB — CBC
HCT: 44.8 % (ref 36.0–46.0)
Hemoglobin: 14.6 g/dL (ref 12.0–15.0)
MCH: 27.9 pg (ref 26.0–34.0)
MCHC: 32.6 g/dL (ref 30.0–36.0)
MCV: 85.5 fL (ref 80.0–100.0)
Platelets: 310 10*3/uL (ref 150–400)
RBC: 5.24 MIL/uL — ABNORMAL HIGH (ref 3.87–5.11)
RDW: 13.2 % (ref 11.5–15.5)
WBC: 11.4 10*3/uL — ABNORMAL HIGH (ref 4.0–10.5)
nRBC: 0 % (ref 0.0–0.2)

## 2019-08-25 LAB — CBG MONITORING, ED
Glucose-Capillary: 393 mg/dL — ABNORMAL HIGH (ref 70–99)
Glucose-Capillary: 292 mg/dL — ABNORMAL HIGH (ref 70–99)
Glucose-Capillary: 320 mg/dL — ABNORMAL HIGH (ref 70–99)

## 2019-08-25 MED ORDER — SODIUM CHLORIDE 0.9 % IV BOLUS
1000.0000 mL | Freq: Once | INTRAVENOUS | Status: AC
  Administered 2019-08-25: 1000 mL via INTRAVENOUS

## 2019-08-25 MED ORDER — ONDANSETRON HCL 4 MG/2ML IJ SOLN
4.0000 mg | Freq: Once | INTRAMUSCULAR | Status: AC
  Administered 2019-08-25: 4 mg via INTRAVENOUS
  Filled 2019-08-25: qty 2

## 2019-08-25 MED ORDER — INSULIN ASPART 100 UNIT/ML ~~LOC~~ SOLN
5.0000 [IU] | Freq: Once | SUBCUTANEOUS | Status: AC
  Administered 2019-08-25: 5 [IU] via INTRAVENOUS

## 2019-08-25 MED ORDER — INSULIN GLARGINE 100 UNIT/ML ~~LOC~~ SOLN
55.0000 [IU] | Freq: Once | SUBCUTANEOUS | Status: AC
  Administered 2019-08-25: 55 [IU] via SUBCUTANEOUS
  Filled 2019-08-25: qty 0.55

## 2019-08-25 MED ORDER — BASAGLAR KWIKPEN 100 UNIT/ML ~~LOC~~ SOPN: PEN_INJECTOR | SUBCUTANEOUS | 0 refills | Status: DC

## 2019-08-25 NOTE — Discharge Instructions (Addendum)
Take your insulin as prescribed.  You need to different kinds of insulin.  You need long-acting Psychologist, sport and exercise, which was prescribed today) and short acting (humalog OR novalog).  Follow-up with your primary care doctor for further diabetes needs. Return to the emergency room with any new, worsening, concerning symptoms.

## 2019-08-25 NOTE — ED Provider Notes (Signed)
[facility] EMERGENCY DEPARTMENT Provider Note   CSN: [id number] Arrival date & time: 08/25/19  1728     History Chief Complaint  Patient presents with  . Hyperglycemia    Elyanna Deblois is a 64 y.o. female presenting for evaluation of hyperglycemia.  Patient states she has been out of her insulin for the past 10 days.  She states she is out of her NovoLog.  She has Humalog at home, but states that she needs her NovoLog.  She is been trying to get this with her primary care doctor, however due to insurance issues the cost is more than $1000.  She states since then, she has not been feeling well.  She has been feeling tired and weak.  She did have 3 episodes of emesis today, but states she was very anxious and upset at the time, which might be the cause of her emesis.  She denies fevers, chills, excessive thirst, chest pain, shortness breath, abdominal pain, urinary symptoms, abnormal bowel movements.  She denies being out of any of her other medications.  Additional history obtained from chart review.  Patient with a history of diabetes, hypertension, polyneuropathy  HPI     Past Medical History:  Diagnosis Date  . Bronchitis   . Cataracts, bilateral   . Depression   . Diabetes mellitus without complication ([place])   . Neuropathy     Patient Active Problem List   Diagnosis Date Noted  . Hyperglycemia 04/11/2018  . Hyperkalemia 04/11/2018  . Hypotension due to hypovolemia 04/09/2018  . Urinary tract infection without hematuria 04/09/2018  . Peripheral polyneuropathy 03/12/2018  . Type 2 diabetes mellitus ([place]) 05/15/2017  . Dyslipidemia associated with type 2 diabetes mellitus ([place]) 12/12/2015  . Essential hypertension 12/12/2015    Past Surgical History:  Procedure Laterality Date  . ABDOMINAL HYSTERECTOMY    . CARPAL TUNNEL RELEASE Right   . KNEE SURGERY Bilateral      OB History    Gravida  2   Para      Term      Preterm      AB       Living  2     SAB      TAB      Ectopic      Multiple      Live Births  2           Family History  Problem Relation Age of Onset  . Diabetes Mother   . Bladder Cancer Mother   . Depression Mother   . Hypertension Father   . Diabetes Sister   . Hypertension Sister   . Depression Sister   . Hypertension Sister   . Heart attack Brother 36  . Depression Brother   . Suicidality Brother   . Skin cancer Brother   . Other Brother        unsure of history - says he does no go to the doctor  . Breast cancer Neg Hx     Social History   Tobacco Use  . Smoking status: Never Smoker  . Smokeless tobacco: Never Used  Substance Use Topics  . Alcohol use: No  . Drug use: No    Home Medications Prior to Admission medications   Medication Sig Start Date End Date Taking? Authorizing Provider  allopurinol (ZYLOPRIM) 300 MG tablet Take 1 tablet (300 mg total) by mouth daily. 06/02/18   Kerin Perna, NP  azithromycin (ZITHROMAX) 250 MG tablet Take  1 tablet (250 mg total) by mouth daily. Take first 2 tablets together, then 1 every day until finished. 02/03/19   Jacqlyn Larsen, PA-C  Blood Glucose Monitoring Suppl (ACCU-CHEK AVIVA PLUS) w/Device KIT 1 each by Does not apply route daily. 10/07/17   Clent Demark, PA-C  buPROPion (WELLBUTRIN XL) 300 MG 24 hr tablet TAKE 1 TABLET (300 MG TOTAL) BY MOUTH DAILY. 05/04/19   Kerin Perna, NP  CELEBREX 100 MG capsule TAKE 1 CAPSULE (100 MG TOTAL) BY MOUTH 2 (TWO) TIMES DAILY. 01/13/19   Kerin Perna, NP  cyclobenzaprine (FLEXERIL) 5 MG tablet Take 1-2 tablets (5-10 mg total) by mouth 2 (two) times daily as needed for muscle spasms. 01/24/19   Wieters, Hallie C, PA-C  dapagliflozin propanediol (FARXIGA) 10 MG TABS tablet Take 10 mg by mouth daily. 06/02/18   Kerin Perna, NP  DULoxetine (CYMBALTA) 60 MG capsule Take 1 capsule (60 mg total) by mouth daily. 06/02/18   Kerin Perna, NP  gabapentin (NEURONTIN) 300  MG capsule Take 1 capsule (300 mg total) by mouth at bedtime. 06/02/18   Kerin Perna, NP  glucose blood (TRUE METRIX BLOOD GLUCOSE TEST) test strip Use as instructed. Must have office visit for refills 01/05/19   Charlott Rakes, MD  guaiFENesin (ROBITUSSIN) 100 MG/5ML liquid Take 10 mLs (200 mg total) by mouth every 4 (four) hours as needed for cough. 02/03/19   Jacqlyn Larsen, PA-C  insulin aspart (NOVOLOG FLEXPEN) 100 UNIT/ML FlexPen Inject 2-10 Units into the skin See admin instructions for 30 days. Inject 2-10 units subcutaneously three times daily before meals per sliding scale: CBG 150-200 = 2 units;  201-251 =  4 units; 251-300 = 6 units;  301-350 = 8 units;  351-400 = 10 units 06/02/18 07/02/18  Kerin Perna, NP  Insulin Glargine (BASAGLAR KWIKPEN) 100 UNIT/ML SOPN INJECT 55 UNITS AT EVERY 12 HOURS 03/16/19   Kerin Perna, NP  Insulin Pen Needle (PEN NEEDLES) 30G X 8 MM MISC Inject 1 application into the skin 5 (five) times daily. 11/25/18   Charlott Rakes, MD  Lancets (ACCU-CHEK SOFT TOUCH) lancets Use TID. 10/07/17   Clent Demark, PA-C  lidocaine (LIDODERM) 5 % Place 1 patch onto the skin daily. Remove & Discard patch within 12 hours or as directed by MD 02/07/19   Kerin Perna, NP  lisinopril (PRINIVIL,ZESTRIL) 10 MG tablet Take 1 tablet (10 mg total) by mouth daily. 06/02/18   Kerin Perna, NP  methocarbamol (ROBAXIN) 500 MG tablet Take 1 tablet (500 mg total) by mouth 2 (two) times daily. 02/03/19   Jacqlyn Larsen, PA-C  naproxen (NAPROSYN) 500 MG tablet Take 1 tablet (500 mg total) by mouth 2 (two) times daily. 01/24/19   Wieters, Hallie C, PA-C  omega-3 acid ethyl esters (LOVAZA) 1 g capsule Take 2 capsules (2 g total) by mouth 2 (two) times daily. 06/02/18   Kerin Perna, NP  rosuvastatin (CRESTOR) 20 MG tablet Take 1 tablet (20 mg total) by mouth daily. 06/02/18   Kerin Perna, NP    Allergies    Cortisone and Penicillins  Review of  Systems   Review of Systems  Constitutional: Positive for fatigue.  Gastrointestinal: Positive for nausea and vomiting.  All other systems reviewed and are negative.   Physical Exam Updated Vital Signs BP 129/70   Pulse 88   Temp 98.3 F (36.8 C) (Oral)   Resp 20   Ht  5' 4"  (1.626 m)   Wt 74.8 kg   SpO2 99%   BMI 28.32 kg/m   Physical Exam Vitals and nursing note reviewed.  Constitutional:      General: She is not in acute distress.    Appearance: She is well-developed.  HENT:     Head: Normocephalic and atraumatic.  Eyes:     Extraocular Movements: Extraocular movements intact.     Conjunctiva/sclera: Conjunctivae normal.     Pupils: Pupils are equal, round, and reactive to light.  Cardiovascular:     Rate and Rhythm: Normal rate and regular rhythm.     Pulses: Normal pulses.  Pulmonary:     Effort: Pulmonary effort is normal. No respiratory distress.     Breath sounds: Normal breath sounds. No wheezing.  Abdominal:     General: There is no distension.     Palpations: Abdomen is soft. There is no mass.     Tenderness: There is no abdominal tenderness. There is no guarding or rebound.     Comments: No tenderness palpation  Musculoskeletal:        General: Normal range of motion.     Cervical back: Normal range of motion and neck supple.  Skin:    General: Skin is warm and dry.     Capillary Refill: Capillary refill takes less than 2 seconds.  Neurological:     Mental Status: She is alert and oriented to person, place, and time.     ED Results / Procedures / Treatments   Labs (all labs ordered are listed, but only abnormal results are displayed) Labs Reviewed  BASIC METABOLIC PANEL - Abnormal; Notable for the following components:      Result Value   Glucose, Bld 397 (*)    All other components within normal limits  CBC - Abnormal; Notable for the following components:   WBC 11.4 (*)    RBC 5.24 (*)    All other components within normal limits   URINALYSIS, ROUTINE W REFLEX MICROSCOPIC - Abnormal; Notable for the following components:   Color, Urine STRAW (*)    Glucose, UA >=500 (*)    Bacteria, UA RARE (*)    All other components within normal limits  CBG MONITORING, ED - Abnormal; Notable for the following components:   Glucose-Capillary 393 (*)    All other components within normal limits  CBG MONITORING, ED - Abnormal; Notable for the following components:   Glucose-Capillary 292 (*)    All other components within normal limits  CBG MONITORING, ED    EKG EKG Interpretation  Date/Time:  Wednesday August 25 2019 19:17:20 EDT Ventricular Rate:  105 PR Interval:    QRS Duration: 104 QT Interval:  381 QTC Calculation: 504 R Axis:   -61 Text Interpretation: Sinus tachycardia Left anterior fascicular block Borderline T wave abnormalities new Prolonged QT interval Confirmed by Blanchie Dessert [id number]) on 08/25/2019 8:28:39 PM   Radiology No results found.  Procedures Procedures (including critical care time)  Medications Ordered in ED Medications  ondansetron (ZOFRAN) injection 4 mg (has no administration in time range)  sodium chloride 0.9 % bolus 1,000 mL (1,000 mLs Intravenous Bolus from Bag 08/25/19 2002)  insulin aspart (novoLOG) injection 5 Units (5 Units Intravenous Given 08/25/19 2011)    ED Course  I have reviewed the triage vital signs and the nursing notes.  Pertinent labs & imaging results that were available during my care of the patient were reviewed by me and considered in my  medical decision making (see chart for details).    MDM Rules/Calculators/A&P                      Patient presenting for evaluation of high blood sugar.  On exam, patient appears nontoxic.  Labs obtained from triage interpreted by me, not consistent with DKA.  However blood sugars are elevated, likely the cause of her symptoms.  Will give fluids, insulin, and reassess.  On reassessment, blood sugar improved to 290s.  She  reports improvement of symptoms.  Tachycardia has resolved.  Will discuss with social work/case management to see if medication assistance can be provided.  Social work/case management discussed with patient.  Upon further investigation, it appears patient is out of her long-acting insulin, thus will provide Basaglar to patient's preferred pharmacy.  As patient is improved, no signs of DKA, will plan for discharge.  At this time, patient appears safe for discharge.  Return precautions given.  Patient states she understands and agrees to plan.  Final Clinical Impression(s) / ED Diagnoses Final diagnoses:  Hyperglycemia    Rx / DC Orders ED Discharge Orders    None       Franchot Heidelberg, PA-C 08/25/19 2121    Blanchie Dessert, MD 08/26/19 2034

## 2019-08-25 NOTE — ED Triage Notes (Signed)
Patient arrives to the ED with comp,aints of nausea and weakness after not being able to take her insulin for the past week. Patient states her blood sugars have been in the 400;s this week. Patient states that she is having insurance problems.

## 2019-08-25 NOTE — Care Management (Addendum)
ED CM met with patient at bedside to discuss medication assistance. Patient reports running out of her insulin.  Patient was unaware that Novolog and Humalog are both short acting insulins. Patient education performed reviewed  Insulins types and how the insulin should be taken, patient verbalized understanding. Patient reports she has run out of Basaglar gargline insulin, Patient will follow up with PCP as soon as possible for further DM teaching. CM updated the EDP and  prescription for Basaglar was sent to [facility] on [address] per patient's request. Patient updated no further ED CM needs identified °

## 2019-12-16 IMAGING — CT CT ANGIO CHEST
4 of 7 series · 18 of 36 positions shown · IV contrast (OMNIPAQUE 350)
Comparison: 04/10/2018

CLINICAL DATA: 04PG2-2E positive patient with worsening back pain
and shortness of breath. Evaluate for pulmonary embolism.

EXAM:
CT ANGIOGRAPHY CHEST WITH CONTRAST
TECHNIQUE: Multidetector CT imaging of the chest was performed using the
standard protocol during bolus administration of intravenous
contrast. Multiplanar CT image reconstructions and MIPs were
obtained to evaluate the vascular anatomy.
CONTRAST:  100mL OMNIPAQUE IOHEXOL 350 MG/ML SOLN

[Series 4: axial st · axial · 0.84mm/px · z∈[+1635,+1823]mm · 7 of 126 slices shown]
[im 16/126  lung]
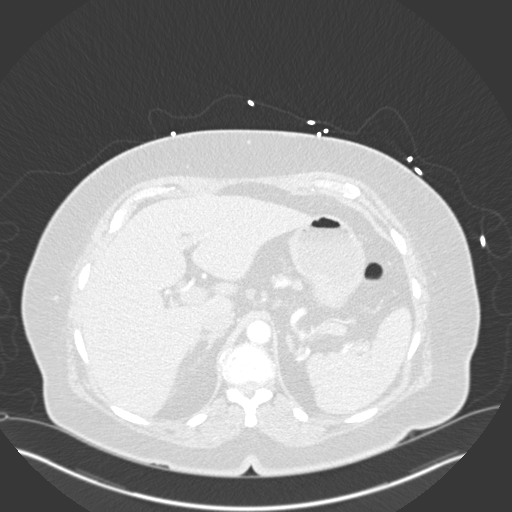
[im 32/126  lung]
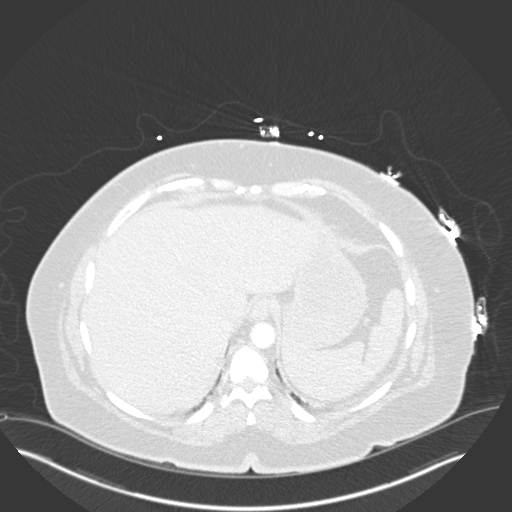
[im 47/126  lung]
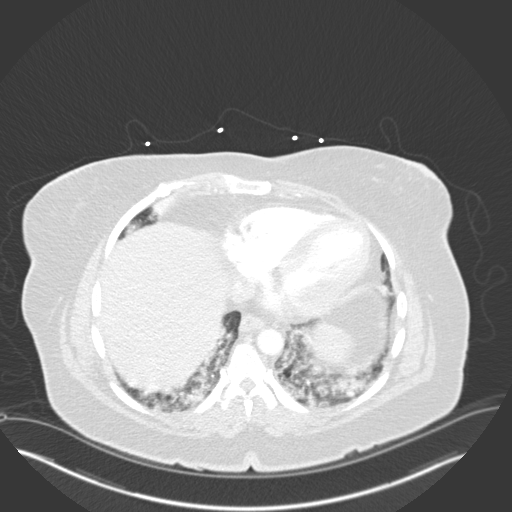
[im 63/126  lung]
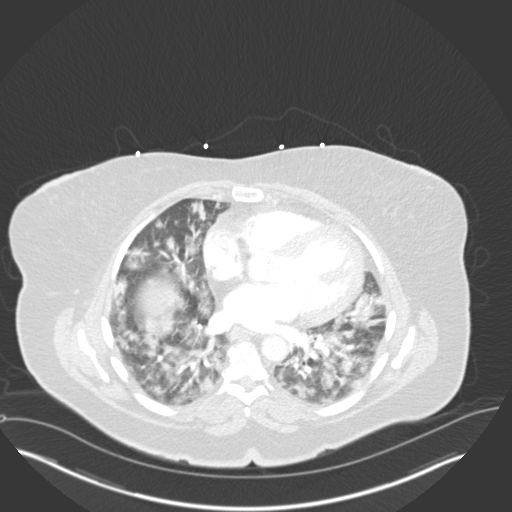
[im 79/126  lung]
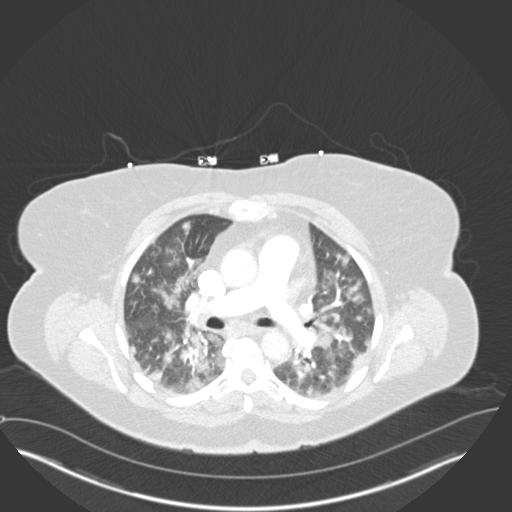
[im 94/126  lung]
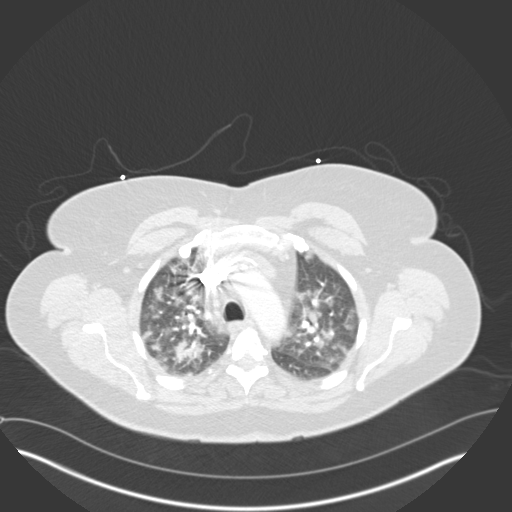
[im 110/126  lung]
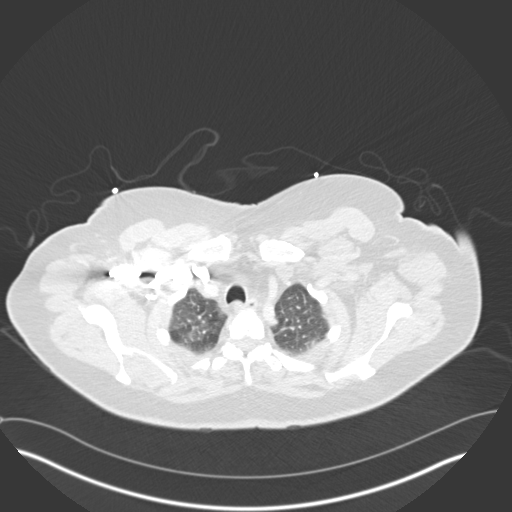

[Series 5: thins · axial · 0.84mm/px · z∈[+1631,+1827]mm · 8 of 126 slices shown]
[im 14/126  lung]
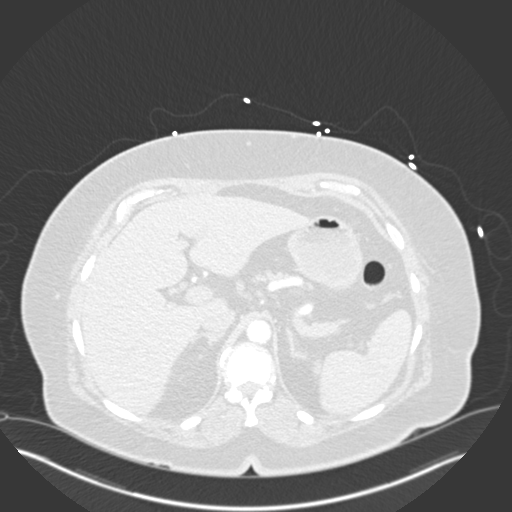
[im 28/126  mediastinal]
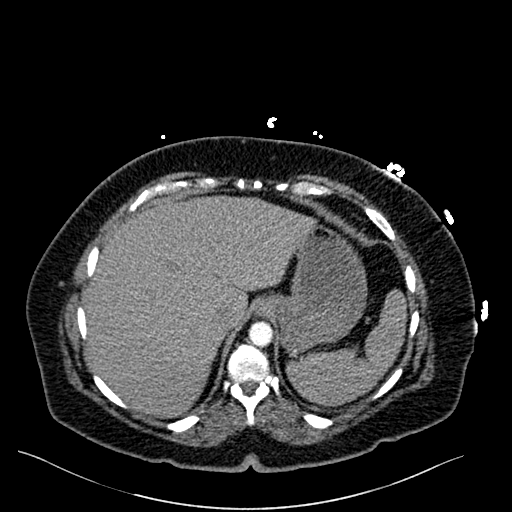
[im 42/126  lung]
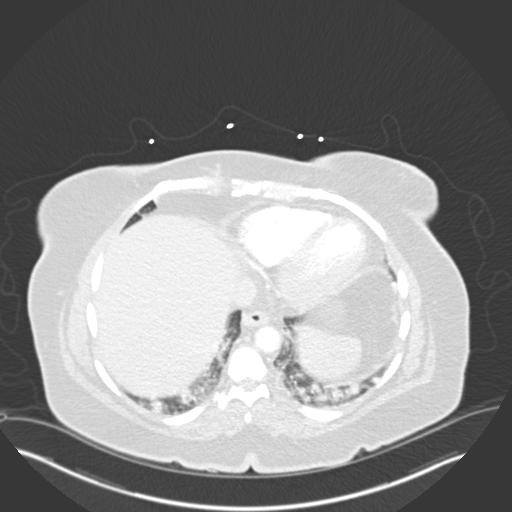
[im 56/126  mediastinal]
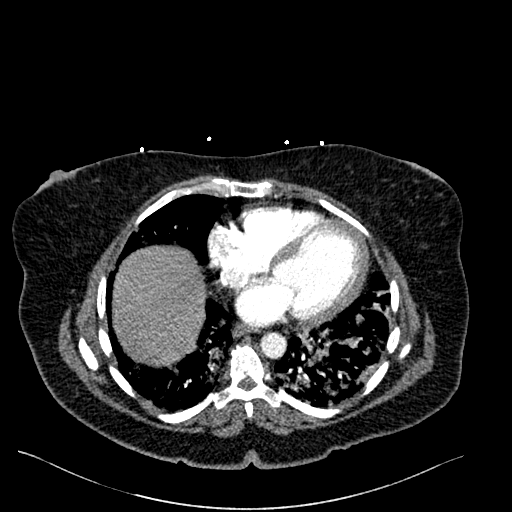
[im 70/126  lung]
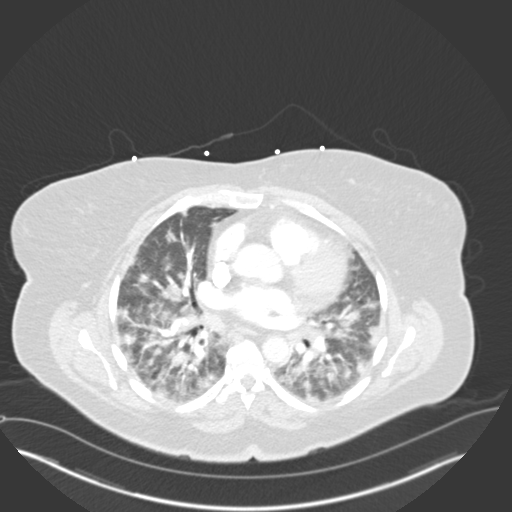
[im 84/126  mediastinal]
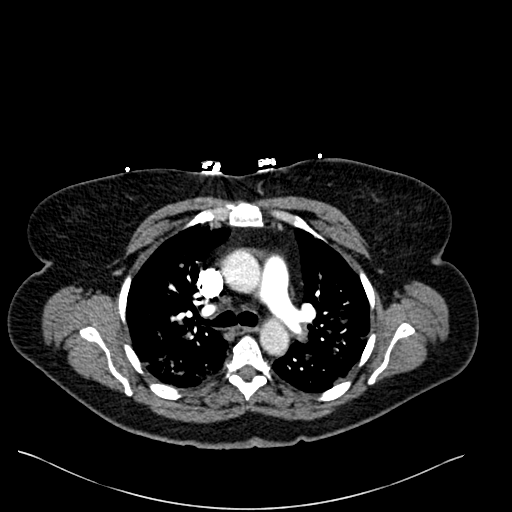
[im 98/126  lung]
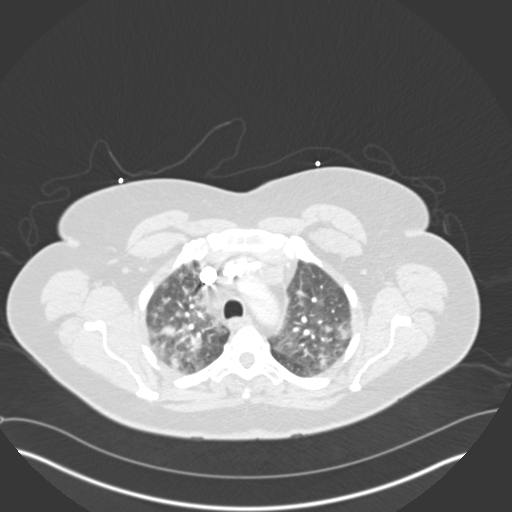
[im 112/126  mediastinal]
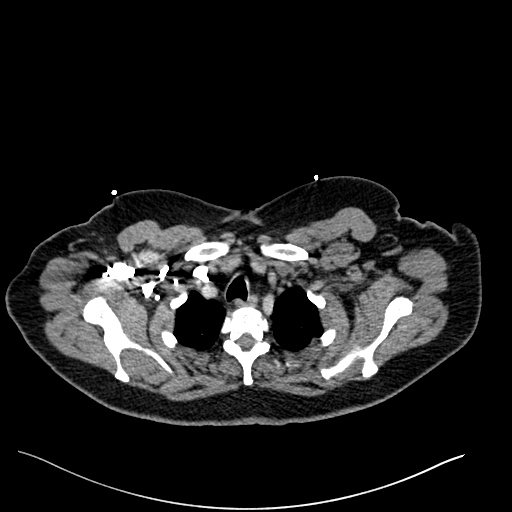

[Series 6: coronal mpr · coronal · 0.57mm/px · 1 of 117 slices shown]
[im 59/117  mediastinal]
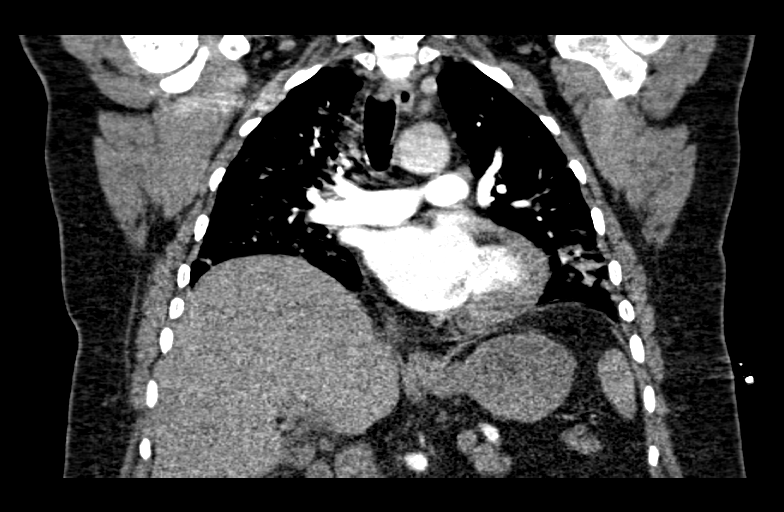

[Series 10: lung · axial · 0.84mm/px · z∈[+1654,+1682]mm · 2 of 112 slices shown]
[im 14/112  mediastinal]
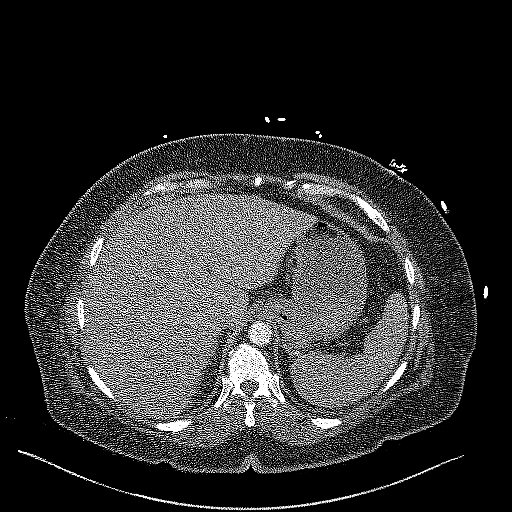
[im 28/112  mediastinal]
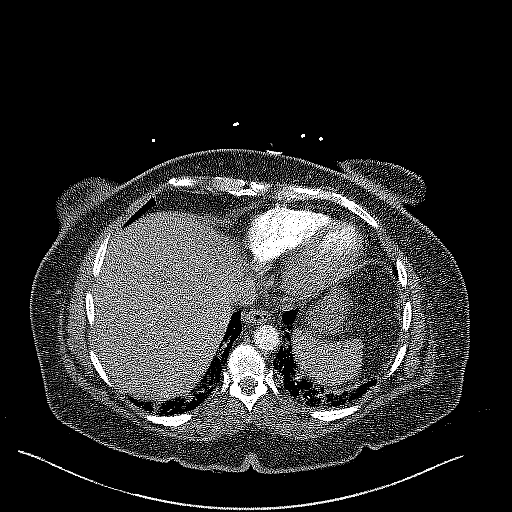

[18 of 36 positions shown; findings below may reference images not displayed]

FINDINGS: Cardiovascular: Heart is normal size. Thoracic aorta is within
normal. Pulmonary arterial system is well opacified without evidence
of emboli. Remaining vascular structures are unremarkable.

Mediastinum/Nodes: 1 cm right infrahilar lymph node. No significant
mediastinal or left hilar adenopathy. Remaining mediastinal
structures are within normal.

Lungs/Pleura: Lungs are adequately inflated and demonstrate moderate
bilateral patchy airspace process throughout the lungs likely
multifocal pneumonia in this 04PG2-2E positive patient. No evidence
of effusion. Airways are normal.

Upper Abdomen: No acute findings.

Musculoskeletal: Minimal degenerative change of the spine. Round
sclerotic focus over the midthoracic spine unchanged likely bone
island.

Review of the MIP images confirms the above findings.
IMPRESSION: 1. Significant bilateral multifocal airspace process likely viral
multifocal pneumonia in this 04PG2-2E positive patient. 1 cm right
infrahilar lymph node likely reactive.

2.  No evidence of pulmonary embolism.

## 2020-09-29 ENCOUNTER — Other Ambulatory Visit: Payer: Self-pay

## 2020-09-29 ENCOUNTER — Ambulatory Visit (INDEPENDENT_AMBULATORY_CARE_PROVIDER_SITE_OTHER): Payer: Medicare Other | Admitting: Endocrinology

## 2020-09-29 VITALS — BP 144/90 | HR 104 | Ht 64.0 in | Wt 148.2 lb

## 2020-09-29 DIAGNOSIS — Z794 Long term (current) use of insulin: Secondary | ICD-10-CM | POA: Diagnosis not present

## 2020-09-29 DIAGNOSIS — E119 Type 2 diabetes mellitus without complications: Secondary | ICD-10-CM

## 2020-09-29 LAB — POCT GLYCOSYLATED HEMOGLOBIN (HGB A1C): HbA1c POC (<> result, manual entry): >15 % (ref 4.0–5.6)

## 2020-09-29 MED ORDER — INSULIN GLARGINE-YFGN 100 UNIT/ML ~~LOC~~ SOPN: 50.0000 [IU] | PEN_INJECTOR | SUBCUTANEOUS | 3 refills | Status: DC

## 2020-09-29 MED ORDER — INSULIN LISPRO (1 UNIT DIAL) 100 UNIT/ML (KWIKPEN): 4.0000 [IU] | PEN_INJECTOR | Freq: Three times a day (TID) | SUBCUTANEOUS | 11 refills | Status: AC

## 2020-09-29 NOTE — Patient Instructions (Addendum)
good diet and exercise significantly improve the control of your diabetes.  please let me know if you wish to be referred to a dietician.  high blood sugar is very risky to your health.  you should see an eye doctor and dentist every year.  It is very important to get all recommended vaccinations.  Controlling your blood pressure and cholesterol drastically reduces the damage diabetes does to your body.  Those who smoke should quit.  Please discuss these with your doctor.  check your blood sugar twice a day.  vary the time of day when you check, between before the 3 meals, and at bedtime.  also check if you have symptoms of your blood sugar being too high or too low.  please keep a record of the readings and bring it to your next appointment here (or you can bring the meter itself).  You can write it on any piece of paper.  please call us sooner if your blood sugar goes below 70, or if most of your readings are over 200. We will need to take this complex situation in stages For now, please change the Humulin to Semglee, 50 units each morning, and: Take Lispro 4-6 units 3 times a day (just before each meal). Please continue the same [place].  Please come back for a follow-up appointment in 6 weeks.

## 2020-09-29 NOTE — Progress Notes (Signed)
Subjective:    Patient ID: Ann Pace, female    DOB: 1955/07/11, 65 y.o.   MRN: [id number]  HPI pt is referred by Carmine Savoy, NP, for diabetes.  Pt states DM was dx'ed in 2002; he is unaware of any chronic complicated by PN and stage 3 CRI; she has been on insulin since 2012; pt says her diet and exercise are; she has never had GDM, pancreatitis, pancreatic surgery, severe hypoglycemia or DKA (but she had HONK in 2020).  She has lost 50 lbs x 2 years.  She is back on insulin x 2 mos, after being off x 8 mos.  She takes NPH 10 units BID, Farxiga, and PRN Humalog (averages approx 15 total units/d).  She says cbg varies from 348-600.   Past Medical History:  Diagnosis Date   Bronchitis    Cataracts, bilateral    Depression    Diabetes mellitus without complication ([place])    Neuropathy     Past Surgical History:  Procedure Laterality Date   ABDOMINAL HYSTERECTOMY     CARPAL TUNNEL RELEASE Right    KNEE SURGERY Bilateral     Social History   Socioeconomic History   Marital status: Widowed    Spouse name: Not on file   Number of children: 2   Years of education: some college   Highest education level: Not on file  Occupational History   Occupation: Unemployed  Tobacco Use   Smoking status: Never   Smokeless tobacco: Never  Vaping Use   Vaping Use: Never used  Substance and Sexual Activity   Alcohol use: No   Drug use: No   Sexual activity: Not on file  Other Topics Concern   Not on file  Social History Narrative   Lives with her parents.    Right-handed.   No caffeine use.   Social Determinants of Health   Financial Resource Strain: Not on file  Food Insecurity: Not on file  Transportation Needs: Not on file  Physical Activity: Not on file  Stress: Not on file  Social Connections: Not on file  Intimate Partner Violence: Not on file    Current Outpatient Medications on File Prior to Visit  Medication Sig Dispense Refill   allopurinol (ZYLOPRIM) 300 MG  tablet Take 1 tablet (300 mg total) by mouth daily. 30 tablet 11   buPROPion (WELLBUTRIN XL) 300 MG 24 hr tablet TAKE 1 TABLET (300 MG TOTAL) BY MOUTH DAILY. 30 tablet 0   CELEBREX 100 MG capsule TAKE 1 CAPSULE (100 MG TOTAL) BY MOUTH 2 (TWO) TIMES DAILY. 60 capsule 3   cyclobenzaprine (FLEXERIL) 5 MG tablet Take 1-2 tablets (5-10 mg total) by mouth 2 (two) times daily as needed for muscle spasms. 24 tablet 0   dapagliflozin propanediol (FARXIGA) 10 MG TABS tablet Take 10 mg by mouth daily. 30 tablet 11   DULoxetine (CYMBALTA) 60 MG capsule Take 1 capsule (60 mg total) by mouth daily. 90 capsule 3   gabapentin (NEURONTIN) 300 MG capsule Take 1 capsule (300 mg total) by mouth at bedtime. 30 capsule 11   guaiFENesin (ROBITUSSIN) 100 MG/5ML liquid Take 10 mLs (200 mg total) by mouth every 4 (four) hours as needed for cough. 60 mL 0   Insulin Pen Needle (PEN NEEDLES) 30G X 8 MM MISC Inject 1 application into the skin 5 (five) times daily. 150 each 11   Lancets (ACCU-CHEK SOFT TOUCH) lancets Use TID. 100 each 12   lidocaine (LIDODERM) 5 % Place 1  patch onto the skin daily. Remove & Discard patch within 12 hours or as directed by MD 30 patch 1   lisinopril (PRINIVIL,ZESTRIL) 10 MG tablet Take 1 tablet (10 mg total) by mouth daily. 90 tablet 3   rosuvastatin (CRESTOR) 20 MG tablet Take 1 tablet (20 mg total) by mouth daily. 90 tablet 1   azithromycin (ZITHROMAX) 250 MG tablet Take 1 tablet (250 mg total) by mouth daily. Take first 2 tablets together, then 1 every day until finished. 6 tablet 0   Blood Glucose Monitoring Suppl (ACCU-CHEK AVIVA PLUS) w/Device KIT 1 each by Does not apply route daily. 1 kit 0   glucose blood (TRUE METRIX BLOOD GLUCOSE TEST) test strip Use as instructed. Must have office visit for refills 100 each 0   methocarbamol (ROBAXIN) 500 MG tablet Take 1 tablet (500 mg total) by mouth 2 (two) times daily. 20 tablet 0   naproxen (NAPROSYN) 500 MG tablet Take 1 tablet (500 mg total) by  mouth 2 (two) times daily. 30 tablet 0   omega-3 acid ethyl esters (LOVAZA) 1 g capsule Take 2 capsules (2 g total) by mouth 2 (two) times daily. 360 capsule 1   No current facility-administered medications on file prior to visit.    Allergies  Allergen Reactions   Cortisone Other (See Comments)    Cortisone shots make BGL go too high   Penicillins Other (See Comments)    Pt reports "pressure in her head"- "a bad headache" Has patient had a PCN reaction causing immediate rash, facial/tongue/throat swelling, SOB or lightheadedness with hypotension: No Has patient had a PCN reaction causing severe rash involving mucus membranes or skin necrosis: No Has patient had a PCN reaction that required hospitalization: No Has patient had a PCN reaction occurring within the last 10 years: No If all of the above answers are "NO", then may proceed with Cephalosporin use.     Family History  Problem Relation Age of Onset   Diabetes Mother    Bladder Cancer Mother    Depression Mother    Hypertension Father    Diabetes Sister    Hypertension Sister    Depression Sister    Hypertension Sister    Heart attack Brother 86   Depression Brother    Suicidality Brother    Skin cancer Brother    Other Brother        unsure of history - says he does no go to the doctor   Breast cancer Neg Hx     BP (!) 144/90 (BP Location: Right Arm, Patient Position: Sitting, Cuff Size: Normal)   Pulse (!) 104   Ht _0  (1.626 m)   Wt 148 lb 3.2 oz (67.2 kg)   SpO2 98%   BMI 25.44 kg/m     Review of Systems denies sob, n/v.  Depression is well-controlled.      Objective:   Physical Exam Pulses: dorsalis pedis intact bilat.   MSK: no deformity of the feet CV: no leg edema Skin:  no ulcer on the feet.  normal color and temp on the feet. Neuro: sensation is intact to touch on the feet, but decreased from normal.   A1c is >14%  I have reviewed outside records, and summarized: Pt was noted to have  elevated A1c, and referred here.  Abd pain and edema were also addressed     Assessment & Plan:  Insulin-requiring type 2 DM: very poor glycemic control.  She may need to d/c multiple  daily injections, as she is not receiving the benefit of this.    Patient Instructions  good diet and exercise significantly improve the control of your diabetes.  please let me know if you wish to be referred to a dietician.  high blood sugar is very risky to your health.  you should see an eye doctor and dentist every year.  It is very important to get all recommended vaccinations.  Controlling your blood pressure and cholesterol drastically reduces the damage diabetes does to your body.  Those who smoke should quit.  Please discuss these with your doctor.  check your blood sugar twice a day.  vary the time of day when you check, between before the 3 meals, and at bedtime.  also check if you have symptoms of your blood sugar being too high or too low.  please keep a record of the readings and bring it to your next appointment here (or you can bring the meter itself).  You can write it on any piece of paper.  please call us sooner if your blood sugar goes below 70, or if most of your readings are over 200. We will need to take this complex situation in stages For now, please change the Humulin to Semglee, 50 units each morning, and: Take Lispro 4-6 units 3 times a day (just before each meal). Please continue the same [place].  Please come back for a follow-up appointment in 6 weeks.

## 2020-10-26 ENCOUNTER — Telehealth: Payer: Self-pay | Admitting: Endocrinology

## 2020-10-26 NOTE — Telephone Encounter (Signed)
error 

## 2020-11-10 ENCOUNTER — Ambulatory Visit: Payer: Medicare Other | Admitting: Endocrinology

## 2020-12-06 ENCOUNTER — Other Ambulatory Visit: Payer: Self-pay

## 2020-12-06 ENCOUNTER — Other Ambulatory Visit: Payer: Self-pay | Admitting: Endocrinology

## 2020-12-06 ENCOUNTER — Ambulatory Visit (INDEPENDENT_AMBULATORY_CARE_PROVIDER_SITE_OTHER): Payer: Medicare Other | Admitting: Endocrinology

## 2020-12-06 VITALS — BP 130/74 | HR 120 | Ht 64.0 in | Wt 158.8 lb

## 2020-12-06 DIAGNOSIS — Z794 Long term (current) use of insulin: Secondary | ICD-10-CM | POA: Diagnosis not present

## 2020-12-06 DIAGNOSIS — E119 Type 2 diabetes mellitus without complications: Secondary | ICD-10-CM

## 2020-12-06 LAB — POCT GLYCOSYLATED HEMOGLOBIN (HGB A1C)

## 2020-12-06 MED ORDER — INSULIN GLARGINE-YFGN 100 UNIT/ML ~~LOC~~ SOPN
90.0000 [IU] | PEN_INJECTOR | SUBCUTANEOUS | 3 refills | Status: DC
  Filled 2020-12-06: qty 30, 33d supply, fill #0

## 2020-12-06 MED ORDER — LANTUS SOLOSTAR 100 UNIT/ML ~~LOC~~ SOPN
90.0000 [IU] | PEN_INJECTOR | Freq: Every morning | SUBCUTANEOUS | 3 refills | Status: DC
  Filled 2020-12-06: qty 90, 100d supply, fill #0

## 2020-12-06 NOTE — Patient Instructions (Addendum)
check your blood sugar twice a day.  vary the time of day when you check, between before the 3 meals, and at bedtime.  also check if you have symptoms of your blood sugar being too high or too low.  please keep a record of the readings and bring it to your next appointment here (or you can bring the meter itself).  You can write it on any piece of paper.  please call us sooner if your blood sugar goes below 70, or if most of your readings are over 200. We will need to take this complex situation in stages.  I have sent a prescription to your pharmacy, to increase the Semglee to 90 units each morning, and:  Take Lispro 4-6 units 3 times a day (just before each meal).  Please continue the same [place].  Please come back for a follow-up appointment in 2 weeks, at 4:30PM

## 2020-12-06 NOTE — Telephone Encounter (Signed)
Need PA or change to LANTUS;TOUJEO;TRESIBA;LEVEMIR PREF'D

## 2020-12-06 NOTE — Progress Notes (Signed)
Subjective:    Patient ID: Ann Pace, female    DOB: Mar 04, 1956, 65 y.o.   MRN: [id number]  HPI Pt returns for f/u of diabetes mellitus: DM type: Insulin-requiring type 2 DM: Dx'ed: 6387 Complications: PN and stage 3 CRI Therapy: insulin since 2012 GDM: never DKA: never (but she had HONK in 2020) Severe hypoglycemia: never Pancreatitis: never Pancreatic imaging: normal on 2015 CT SDOH: none Other: she takes multiple daily injections Interval history: no cbg record, but states cbg's vary from 400-600.  She takes insulins as rx'ed.  Several days ago, she took 20 units of humalog and 55 units of semglee.  CBG did not improve.  Several years ago, she was on 150 units/d.  She says 1 semglee pen lasts approx 4 days.  Pt says insulin does not leak out after injection.   Past Medical History:  Diagnosis Date   Bronchitis    Cataracts, bilateral    Depression    Diabetes mellitus without complication ([place])    Neuropathy     Past Surgical History:  Procedure Laterality Date   ABDOMINAL HYSTERECTOMY     CARPAL TUNNEL RELEASE Right    KNEE SURGERY Bilateral     Social History   Socioeconomic History   Marital status: Widowed    Spouse name: Not on file   Number of children: 2   Years of education: some college   Highest education level: Not on file  Occupational History   Occupation: Unemployed  Tobacco Use   Smoking status: Never   Smokeless tobacco: Never  Vaping Use   Vaping Use: Never used  Substance and Sexual Activity   Alcohol use: No   Drug use: No   Sexual activity: Not on file  Other Topics Concern   Not on file  Social History Narrative   Lives with her parents.    Right-handed.   No caffeine use.   Social Determinants of Health   Financial Resource Strain: Not on file  Food Insecurity: Not on file  Transportation Needs: Not on file  Physical Activity: Not on file  Stress: Not on file  Social Connections: Not on file  Intimate Partner Violence:  Not on file    Current Outpatient Medications on File Prior to Visit  Medication Sig Dispense Refill   allopurinol (ZYLOPRIM) 300 MG tablet Take 1 tablet (300 mg total) by mouth daily. 30 tablet 11   buPROPion (WELLBUTRIN XL) 300 MG 24 hr tablet TAKE 1 TABLET (300 MG TOTAL) BY MOUTH DAILY. 30 tablet 0   CELEBREX 100 MG capsule TAKE 1 CAPSULE (100 MG TOTAL) BY MOUTH 2 (TWO) TIMES DAILY. 60 capsule 3   cyclobenzaprine (FLEXERIL) 5 MG tablet Take 1-2 tablets (5-10 mg total) by mouth 2 (two) times daily as needed for muscle spasms. 24 tablet 0   dapagliflozin propanediol (FARXIGA) 10 MG TABS tablet Take 10 mg by mouth daily. 30 tablet 11   DULoxetine (CYMBALTA) 60 MG capsule Take 1 capsule (60 mg total) by mouth daily. 90 capsule 3   gabapentin (NEURONTIN) 300 MG capsule Take 1 capsule (300 mg total) by mouth at bedtime. 30 capsule 11   insulin lispro (HUMALOG) 100 UNIT/ML KwikPen Inject 4-6 Units into the skin 3 (three) times daily with meals. 15 mL 11   Insulin Pen Needle (PEN NEEDLES) 30G X 8 MM MISC Inject 1 application into the skin 5 (five) times daily. 150 each 11   Lancets (ACCU-CHEK SOFT TOUCH) lancets Use TID. 100 each 12  lidocaine (LIDODERM) 5 % Place 1 patch onto the skin daily. Remove & Discard patch within 12 hours or as directed by MD 30 patch 1   lisinopril (PRINIVIL,ZESTRIL) 10 MG tablet Take 1 tablet (10 mg total) by mouth daily. 90 tablet 3   rosuvastatin (CRESTOR) 20 MG tablet Take 1 tablet (20 mg total) by mouth daily. 90 tablet 1   Blood Glucose Monitoring Suppl (ACCU-CHEK AVIVA PLUS) w/Device KIT 1 each by Does not apply route daily. 1 kit 0   glucose blood (TRUE METRIX BLOOD GLUCOSE TEST) test strip Use as instructed. Must have office visit for refills 100 each 0   omega-3 acid ethyl esters (LOVAZA) 1 g capsule Take 2 capsules (2 g total) by mouth 2 (two) times daily. 360 capsule 1   No current facility-administered medications on file prior to visit.    Allergies   Allergen Reactions   Cortisone Other (See Comments)    Cortisone shots make BGL go too high   Penicillins Other (See Comments)    Pt reports "pressure in her head"- "a bad headache" Has patient had a PCN reaction causing immediate rash, facial/tongue/throat swelling, SOB or lightheadedness with hypotension: No Has patient had a PCN reaction causing severe rash involving mucus membranes or skin necrosis: No Has patient had a PCN reaction that required hospitalization: No Has patient had a PCN reaction occurring within the last 10 years: No If all of the above answers are "NO", then may proceed with Cephalosporin use.     Family History  Problem Relation Age of Onset   Diabetes Mother    Bladder Cancer Mother    Depression Mother    Hypertension Father    Diabetes Sister    Hypertension Sister    Depression Sister    Hypertension Sister    Heart attack Brother 73   Depression Brother    Suicidality Brother    Skin cancer Brother    Other Brother        unsure of history - says he does no go to the doctor   Breast cancer Neg Hx     BP 130/74 (BP Location: Right Arm, Patient Position: Sitting, Cuff Size: Normal)   Pulse (!) 120   Ht _0  (1.626 m)   Wt 158 lb 12.8 oz (72 kg)   SpO2 97%   BMI 27.26 kg/m   Review of Systems She has regained a few lbs.    Objective:   Physical Exam Pulses: dorsalis pedis intact bilat.   MSK: no deformity of the feet, except for prominence of the right foot at the bunionette area, with callus at the plantar aspect. CV: no leg edema Skin:  no ulcer on the feet.  normal color and temp on the feet. Neuro: sensation is intact to touch on the feet, but decreased from normal.   A1c is >15%     Assessment & Plan:  Insulin-requiring type 2 DM: severe exacerbation.    Patient Instructions  check your blood sugar twice a day.  vary the time of day when you check, between before the 3 meals, and at bedtime.  also check if you have symptoms  of your blood sugar being too high or too low.  please keep a record of the readings and bring it to your next appointment here (or you can bring the meter itself).  You can write it on any piece of paper.  please call us sooner if your blood sugar goes below 70,  or if most of your readings are over 200. We will need to take this complex situation in stages.  I have sent a prescription to your pharmacy, to increase the Semglee to 90 units each morning, and:  Take Lispro 4-6 units 3 times a day (just before each meal).  Please continue the same [place].  Please come back for a follow-up appointment in 2 weeks, at 4:30PM

## 2020-12-07 ENCOUNTER — Other Ambulatory Visit: Payer: Self-pay

## 2020-12-14 ENCOUNTER — Other Ambulatory Visit: Payer: Self-pay

## 2020-12-20 ENCOUNTER — Other Ambulatory Visit: Payer: Self-pay

## 2020-12-21 ENCOUNTER — Other Ambulatory Visit: Payer: Self-pay

## 2020-12-21 ENCOUNTER — Telehealth: Payer: Self-pay | Admitting: Endocrinology

## 2020-12-21 ENCOUNTER — Ambulatory Visit (INDEPENDENT_AMBULATORY_CARE_PROVIDER_SITE_OTHER): Payer: Medicare Other | Admitting: Endocrinology

## 2020-12-21 VITALS — BP 160/70 | HR 95 | Ht 64.0 in | Wt 167.2 lb

## 2020-12-21 DIAGNOSIS — E119 Type 2 diabetes mellitus without complications: Secondary | ICD-10-CM

## 2020-12-21 DIAGNOSIS — Z794 Long term (current) use of insulin: Secondary | ICD-10-CM

## 2020-12-21 LAB — POCT GLYCOSYLATED HEMOGLOBIN (HGB A1C): Hemoglobin A1C: 14.1 % — AB (ref 4.0–5.6)

## 2020-12-21 NOTE — Patient Instructions (Addendum)
check your blood sugar twice a day.  vary the time of day when you check, between before the 3 meals, and at bedtime.  also check if you have symptoms of your blood sugar being too high or too low.  please keep a record of the readings and bring it to your next appointment here (or you can bring the meter itself).  You can write it on any piece of paper.  please call us sooner if your blood sugar goes below 70, or if most of your readings are over 200.   We will need to take this complex situation in stages.  Blood tests are requested for you today.  We'll let you know about the results.   Please come back for a follow-up appointment in 2 months.

## 2020-12-21 NOTE — Progress Notes (Addendum)
Subjective:    Patient ID: Ann Pace, female    DOB: [birth year], 65 y.o.   MRN: [id number]  HPI Pt returns for f/u of diabetes mellitus: DM type: Insulin-requiring type 2 DM: Dx'ed: 2179 Complications: PN and stage 3 CRI Therapy: insulin since 2012 GDM: never DKA: never (but she had HONK in 2020) Severe hypoglycemia: never Pancreatitis: never Pancreatic imaging: normal on 2015 CT SDOH: none Other: she takes multiple daily injections, but basal insulin is emphasized.  Interval history: no cbg record, but states cbg's vary from 112-250.  It is in general higher as the day goes on.  She sometimes takes less than the rx'ed amount, out of fear of hypoglycemia.   Past Medical History:  Diagnosis Date   Bronchitis    Cataracts, bilateral    Depression    Diabetes mellitus without complication ([place])    Neuropathy     Past Surgical History:  Procedure Laterality Date   ABDOMINAL HYSTERECTOMY     CARPAL TUNNEL RELEASE Right    KNEE SURGERY Bilateral     Social History   Socioeconomic History   Marital status: Widowed    Spouse name: Not on file   Number of children: 2   Years of education: some college   Highest education level: Not on file  Occupational History   Occupation: Unemployed  Tobacco Use   Smoking status: Never   Smokeless tobacco: Never  Vaping Use   Vaping Use: Never used  Substance and Sexual Activity   Alcohol use: No   Drug use: No   Sexual activity: Not on file  Other Topics Concern   Not on file  Social History Narrative   Lives with her parents.    Right-handed.   No caffeine use.   Social Determinants of Health   Financial Resource Strain: Not on file  Food Insecurity: Not on file  Transportation Needs: Not on file  Physical Activity: Not on file  Stress: Not on file  Social Connections: Not on file  Intimate Partner Violence: Not on file    Current Outpatient Medications on File Prior to Visit  Medication Sig Dispense Refill    allopurinol (ZYLOPRIM) 300 MG tablet Take 1 tablet (300 mg total) by mouth daily. 30 tablet 11   buPROPion (WELLBUTRIN XL) 300 MG 24 hr tablet TAKE 1 TABLET (300 MG TOTAL) BY MOUTH DAILY. 30 tablet 0   CELEBREX 100 MG capsule TAKE 1 CAPSULE (100 MG TOTAL) BY MOUTH 2 (TWO) TIMES DAILY. 60 capsule 3   cyclobenzaprine (FLEXERIL) 5 MG tablet Take 1-2 tablets (5-10 mg total) by mouth 2 (two) times daily as needed for muscle spasms. 24 tablet 0   dapagliflozin propanediol (FARXIGA) 10 MG TABS tablet Take 10 mg by mouth daily. 30 tablet 11   DULoxetine (CYMBALTA) 60 MG capsule Take 1 capsule (60 mg total) by mouth daily. 90 capsule 3   gabapentin (NEURONTIN) 300 MG capsule Take 1 capsule (300 mg total) by mouth at bedtime. 30 capsule 11   insulin glargine (LANTUS SOLOSTAR) 100 UNIT/ML Solostar Pen Inject 90 Units into the skin in the morning. 90 mL 3   insulin lispro (HUMALOG) 100 UNIT/ML KwikPen Inject 4-6 Units into the skin 3 (three) times daily with meals. 15 mL 11   Insulin Pen Needle (PEN NEEDLES) 30G X 8 MM MISC Inject 1 application into the skin 5 (five) times daily. 150 each 11   Lancets (ACCU-CHEK SOFT TOUCH) lancets Use TID. 100 each 12  lidocaine (LIDODERM) 5 % Place 1 patch onto the skin daily. Remove & Discard patch within 12 hours or as directed by MD 30 patch 1   lisinopril (PRINIVIL,ZESTRIL) 10 MG tablet Take 1 tablet (10 mg total) by mouth daily. 90 tablet 3   rosuvastatin (CRESTOR) 20 MG tablet Take 1 tablet (20 mg total) by mouth daily. 90 tablet 1   Blood Glucose Monitoring Suppl (ACCU-CHEK AVIVA PLUS) w/Device KIT 1 each by Does not apply route daily. 1 kit 0   glucose blood (TRUE METRIX BLOOD GLUCOSE TEST) test strip Use as instructed. Must have office visit for refills 100 each 0   omega-3 acid ethyl esters (LOVAZA) 1 g capsule Take 2 capsules (2 g total) by mouth 2 (two) times daily. 360 capsule 1   No current facility-administered medications on file prior to visit.     Allergies  Allergen Reactions   Cortisone Other (See Comments)    Cortisone shots make BGL go too high   Penicillins Other (See Comments)    Pt reports "pressure in her head"- "a bad headache" Has patient had a PCN reaction causing immediate rash, facial/tongue/throat swelling, SOB or lightheadedness with hypotension: No Has patient had a PCN reaction causing severe rash involving mucus membranes or skin necrosis: No Has patient had a PCN reaction that required hospitalization: No Has patient had a PCN reaction occurring within the last 10 years: No If all of the above answers are "NO", then may proceed with Cephalosporin use.     Family History  Problem Relation Age of Onset   Diabetes Mother    Bladder Cancer Mother    Depression Mother    Hypertension Father    Diabetes Sister    Hypertension Sister    Depression Sister    Hypertension Sister    Heart attack Brother 22   Depression Brother    Suicidality Brother    Skin cancer Brother    Other Brother        unsure of history - says he does no go to the doctor   Breast cancer Neg Hx     BP (!) 160/70 (BP Location: Right Arm, Patient Position: Sitting, Cuff Size: Normal)   Pulse 95   Ht _0  (1.626 m)   Wt 167 lb 3.2 oz (75.8 kg)   SpO2 98%   BMI 28.70 kg/m   Review of Systems She denies hypoglycemia.      Objective:   Physical Exam Pulses: dorsalis pedis intact bilat.   MSK: no deformity of the feet CV: no leg edema Skin:  no ulcer on the feet.  normal color and temp on the feet.   Neuro: sensation is intact to touch on the feet.   EXT: right foot bunionette, with callus at the plantar aspect.       Assessment & Plan:  Insulin-requiring type 2 DM: uncontrolled. Please increase the Humalog to 6-8 units 3 times a day (just before each meal) Check fructosamine.   Patient Instructions  check your blood sugar twice a day.  vary the time of day when you check, between before the 3 meals, and at bedtime.   also check if you have symptoms of your blood sugar being too high or too low.  please keep a record of the readings and bring it to your next appointment here (or you can bring the meter itself).  You can write it on any piece of paper.  please call us sooner if your blood  sugar goes below 70, or if most of your readings are over 200.   We will need to take this complex situation in stages.  Blood tests are requested for you today.  We'll let you know about the results.   Please come back for a follow-up appointment in 2 months.

## 2020-12-21 NOTE — Telephone Encounter (Signed)
Patient advised at check out that she has a Administrator, [employer] for [employer] - RX needs to go to the [facility] in [place]

## 2020-12-22 LAB — BASIC METABOLIC PANEL
BUN: 12 mg/dL (ref 6–23)
CO2: 25 mEq/L (ref 19–32)
Calcium: 9.7 mg/dL (ref 8.4–10.5)
Chloride: 104 mEq/L (ref 96–112)
Creatinine, Ser: 0.85 mg/dL (ref 0.40–1.20)
GFR: 72.8 mL/min (ref >60.00)
Glucose, Bld: 330 mg/dL — ABNORMAL HIGH (ref 70–99)
Potassium: 3.9 mEq/L (ref 3.5–5.1)
Sodium: 140 mEq/L (ref 135–145)

## 2020-12-22 LAB — T4, FREE: Free T4: 0.79 ng/dL (ref 0.60–1.60)

## 2020-12-22 LAB — TSH: TSH: 0.77 u[IU]/mL (ref 0.35–5.50)

## 2020-12-24 LAB — FRUCTOSAMINE: Fructosamine: 381 umol/L — ABNORMAL HIGH (ref 205–285)

## 2020-12-25 ENCOUNTER — Other Ambulatory Visit: Payer: Self-pay | Admitting: Endocrinology

## 2020-12-25 MED ORDER — INSULIN GLARGINE-YFGN 100 UNIT/ML ~~LOC~~ SOPN: 90.0000 [IU] | PEN_INJECTOR | SUBCUTANEOUS | 3 refills | Status: AC

## 2020-12-27 ENCOUNTER — Other Ambulatory Visit (HOSPITAL_COMMUNITY): Payer: Self-pay

## 2021-02-20 ENCOUNTER — Telehealth: Payer: Self-pay | Admitting: Endocrinology

## 2021-02-20 NOTE — Telephone Encounter (Signed)
Patient called re: Patient states Dr. Everardo All is out of network and is unable to afford to see out of network Physicians therefor Patient will not be rescheduling appointment at this time

## 2021-02-21 ENCOUNTER — Ambulatory Visit: Payer: Medicare Other | Admitting: Endocrinology

## 2021-03-16 ENCOUNTER — Telehealth: Payer: Self-pay | Admitting: Endocrinology

## 2021-03-16 NOTE — Telephone Encounter (Signed)
NA

## 2021-03-28 ENCOUNTER — Other Ambulatory Visit: Payer: Self-pay

## 2021-03-28 DIAGNOSIS — Z794 Long term (current) use of insulin: Secondary | ICD-10-CM

## 2021-03-28 DIAGNOSIS — E119 Type 2 diabetes mellitus without complications: Secondary | ICD-10-CM

## 2021-03-28 MED ORDER — INSULIN GLARGINE 100 UNIT/ML SOLOSTAR PEN: 90.0000 [IU] | PEN_INJECTOR | SUBCUTANEOUS | 3 refills | Status: AC
